# Patient Record
Sex: Female | Born: 2000 | Race: Black or African American | Hispanic: No | Marital: Single | Smoking: Never smoker
Health system: Southern US, Community
[De-identification: ages and names within clinical notes are randomized; demographics above are authoritative.]

## PROBLEM LIST (undated history)

## (undated) DIAGNOSIS — L309 Dermatitis, unspecified: Secondary | ICD-10-CM

## (undated) DIAGNOSIS — G43909 Migraine, unspecified, not intractable, without status migrainosus: Secondary | ICD-10-CM

## (undated) HISTORY — PX: CERVICAL CERCLAGE: SHX1329

## (undated) HISTORY — DX: Dermatitis, unspecified: L30.9

## (undated) HISTORY — DX: Migraine, unspecified, not intractable, without status migrainosus: G43.909

---
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
## 2020-01-26 DIAGNOSIS — N912 Amenorrhea, unspecified: Secondary | ICD-10-CM | POA: Diagnosis not present

## 2020-01-26 DIAGNOSIS — Z3491 Encounter for supervision of normal pregnancy, unspecified, first trimester: Secondary | ICD-10-CM | POA: Diagnosis not present

## 2020-01-26 DIAGNOSIS — O3680X9 Pregnancy with inconclusive fetal viability, other fetus: Secondary | ICD-10-CM | POA: Diagnosis not present

## 2020-01-26 DIAGNOSIS — Z113 Encounter for screening for infections with a predominantly sexual mode of transmission: Secondary | ICD-10-CM | POA: Diagnosis not present

## 2020-01-26 DIAGNOSIS — Z3A09 9 weeks gestation of pregnancy: Secondary | ICD-10-CM | POA: Diagnosis not present

## 2020-02-19 ENCOUNTER — Encounter (HOSPITAL_BASED_OUTPATIENT_CLINIC_OR_DEPARTMENT_OTHER): Payer: Self-pay

## 2020-02-19 ENCOUNTER — Emergency Department (HOSPITAL_BASED_OUTPATIENT_CLINIC_OR_DEPARTMENT_OTHER)
Admission: EM | Admit: 2020-02-19 | Discharge: 2020-02-19 | Disposition: A | Discharge status: Home / Self Care | Payer: Medicaid Other | Attending: Emergency Medicine | Admitting: Emergency Medicine

## 2020-02-19 ENCOUNTER — Other Ambulatory Visit: Payer: Self-pay

## 2020-02-19 DIAGNOSIS — O219 Vomiting of pregnancy, unspecified: Secondary | ICD-10-CM | POA: Diagnosis not present

## 2020-02-19 DIAGNOSIS — Z3A12 12 weeks gestation of pregnancy: Secondary | ICD-10-CM | POA: Diagnosis not present

## 2020-02-19 DIAGNOSIS — R112 Nausea with vomiting, unspecified: Secondary | ICD-10-CM

## 2020-02-19 NOTE — ED Triage Notes (Signed)
Pt states she vomited x 1 at 8pm "it had blood in it"-pt states she is [redacted] weeks pregnant-her OB is [facility] in [place]

## 2020-02-19 NOTE — Discharge Instructions (Addendum)
You have been seen today for vomiting. Please read and follow all provided instructions. Return to the emergency room for worsening condition or new concerning symptoms.    1. Medications:  Continue usual home medications Take medications as prescribed. Please review all of the medicines and only take them if you do not have an allergy to them.   2. Treatment: rest, drink plenty of fluids  3. Follow Up:  Please follow up with OBGYN doctor by scheduling an appointment as soon as possible for a visit     ?

## 2020-02-19 NOTE — ED Provider Notes (Signed)
[facility] EMERGENCY DEPARTMENT Provider Note   CSN: [id number] Arrival date & time: 02/19/20  2047     History Chief Complaint  Patient presents with  . Emesis During Pregnancy    Tammie Davis is a 19 y.o. female G2P0010 currently 101w6d gestation presents to emergency department today with chief complaint of acute onset of emesis. Patient states she was at work and after eating lunch had sudden onset of nausea.  She had one episode of emesis.  She describes her emesis as being mostly water she just drink and small amount of right red blood visible. She denies any associated chest pain or abdominal pain.  She not take any medications for symptoms prior to arrival.  Patient does not drink alcohol or use any drugs. Denies fever, chills, chest pain, shortness of breath, abdominal pain, pelvic pain, cramping, abnormal vaginal bleeding, vaginal discharge.  Her OB is [facility].  Her next OB appointment is scheduled for 02/26/2020.    Past Medical History:  Diagnosis Date  . Eczema   . Migraines     Patient Active Problem List   Diagnosis Date Noted  . Encounter for surveillance of injectable contraceptive 12/04/2018  . 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 Acne vulgaris 11/10/2016  . Headache, migraine 01/20/2015    History reviewed. No pertinent surgical history.   OB History    Gravida  2   Para  0   Term      Preterm      AB  1   Living        SAB  1   TAB      Ectopic      Multiple      Live Births              Family History  Problem Relation Age of Onset  . Asthma Sister     Social History   Tobacco Use  . Smoking status: Never Smoker  . Smokeless tobacco: Never Used  Substance Use Topics  . Alcohol use: No  . Drug use: No    Home Medications Prior to Admission medications   Medication Sig Start Date End Date Taking? Authorizing Provider  cephALEXin (KEFLEX) 500 MG capsule Take 1 capsule (500 mg total) by  mouth 4 (four) times daily. 09/04/19   Mesner, Barbara Cower, MD  ondansetron (ZOFRAN) 4 MG tablet Take 1 tablet (4 mg total) by mouth every 8 (eight) hours as needed for nausea or vomiting. 09/04/19   Mesner, Barbara Cower, MD    Allergies    Patient has no known allergies.  Review of Systems   Review of Systems  All other systems are reviewed and are negative for acute change except as noted in the HPI.   Physical Exam Updated Vital Signs BP 107/60 (BP Location: Right Arm)   Pulse 90   Temp 98.4 F (36.9 C) (Oral)   Resp 16   Ht 5\' 1"  (1.549 m)   Wt 49.4 kg   LMP 08/11/2019   SpO2 100%   BMI 20.60 kg/m   Physical Exam Vitals and nursing note reviewed.  Constitutional:      General: She is not in acute distress.    Appearance: She is not ill-appearing.  HENT:     Head: Normocephalic and atraumatic.     Right Ear: Tympanic membrane and external ear normal.     Left Ear: Tympanic membrane and external ear normal.     Nose: Nose normal.  Mouth/Throat:     Mouth: Mucous membranes are moist.     Pharynx: Oropharynx is clear.  Eyes:     General: No scleral icterus.       Right eye: No discharge.        Left eye: No discharge.     Extraocular Movements: Extraocular movements intact.     Conjunctiva/sclera: Conjunctivae normal.     Pupils: Pupils are equal, round, and reactive to light.  Neck:     Vascular: No JVD.  Cardiovascular:     Rate and Rhythm: Normal rate and regular rhythm.     Pulses: Normal pulses.          Radial pulses are 2+ on the right side and 2+ on the left side.     Heart sounds: Normal heart sounds.  Pulmonary:     Comments: Lungs clear to auscultation in all fields. Symmetric chest rise. No wheezing, rales, or rhonchi. Abdominal:     Comments: Gravid abdomen. Non-tender in all quadrants. No rigidity, no guarding. No peritoneal signs. Normoactive bowel sounds  Musculoskeletal:        General: Normal range of motion.     Cervical back: Normal range of motion.   Skin:    General: Skin is warm and dry.     Capillary Refill: Capillary refill takes less than 2 seconds.  Neurological:     Mental Status: She is oriented to person, place, and time.     GCS: GCS eye subscore is 4. GCS verbal subscore is 5. GCS motor subscore is 6.     Comments: Fluent speech, no facial droop.  Psychiatric:        Behavior: Behavior normal.     ED Results / Procedures / Treatments   Labs (all labs ordered are listed, but only abnormal results are displayed) Labs Reviewed - No data to display  EKG None  Radiology No results found.  Procedures Procedures (including critical care time)  Medications Ordered in ED Medications - No data to display  ED Course  I have reviewed the triage vital signs and the nursing notes.  Pertinent labs & imaging results that were available during my care of the patient were reviewed by me and considered in my medical decision making (see chart for details).    MDM Rules/Calculators/A&P                      She had blood pressure of 105/35 documented in triage.  I evaluated the patient and I rechecked her pressure and it was 106/62.  The RN later checked patient's blood pressure and it was 107/60.  I question the accuracy of the initial blood pressure as she has had multiple normal readings since triage.  Patient is afebrile, no tachycardia, no hypoxia. On exam she is very well-appearing.  She is in no acute distress.  Lungs are clear to auscultation all fields.  Abdomen is nontender, no peritoneal signs. No posterior erythema. Throat exam is normal. No signs of dehydration.  She has had no further episodes of emesis, just the one causing her to come in and seek evaluation tonight. She is tolerating PO intake while here in the ED.  Engaged in shared decision making regarding lab work to check hemoglobin, electrolytes, kidney function.  Patient would rather follow-up with her OB/GYN for blood work as she is feeling asymptomatic  at this time. She declines chest xray as well as she would like to avoid radiation exposure.  I feel this is reasonable given her reassuring exam and vitals.  The patient appears reasonably screened and/or stabilized for discharge and I doubt any other medical condition or other [facility] requiring further screening, evaluation, or treatment in the ED at this time prior to discharge. The patient is safe for discharge with strict return precautions discussed. Patient appears reliable for follow up.   Portions of this note were generated with Lobbyist. Dictation errors may occur despite best attempts at proofreading.   Final Clinical Impression(s) / ED Diagnoses Final diagnoses:  Non-intractable vomiting with nausea, unspecified vomiting type    Rx / DC Orders ED Discharge Orders    None       Cherre Robins, PA-C 02/20/20 0914    Lennice Sites, DO 02/20/20 2352

## 2020-02-19 NOTE — ED Notes (Signed)
Fluid challenge passed, no issues

## 2020-03-03 ENCOUNTER — Emergency Department (HOSPITAL_BASED_OUTPATIENT_CLINIC_OR_DEPARTMENT_OTHER)
Admission: EM | Admit: 2020-03-03 | Discharge: 2020-03-03 | Disposition: A | Discharge status: Home / Self Care | Payer: Medicaid Other | Attending: Emergency Medicine | Admitting: Emergency Medicine

## 2020-03-03 ENCOUNTER — Encounter (HOSPITAL_BASED_OUTPATIENT_CLINIC_OR_DEPARTMENT_OTHER): Payer: Self-pay | Admitting: Emergency Medicine

## 2020-03-03 ENCOUNTER — Other Ambulatory Visit: Payer: Self-pay

## 2020-03-03 DIAGNOSIS — O219 Vomiting of pregnancy, unspecified: Secondary | ICD-10-CM | POA: Diagnosis not present

## 2020-03-03 DIAGNOSIS — Z3492 Encounter for supervision of normal pregnancy, unspecified, second trimester: Secondary | ICD-10-CM | POA: Diagnosis not present

## 2020-03-03 DIAGNOSIS — R112 Nausea with vomiting, unspecified: Secondary | ICD-10-CM

## 2020-03-03 DIAGNOSIS — Z3A14 14 weeks gestation of pregnancy: Secondary | ICD-10-CM | POA: Insufficient documentation

## 2020-03-03 LAB — COMPREHENSIVE METABOLIC PANEL
ALT: 14 U/L (ref 0–44)
AST: 17 U/L (ref 15–41)
Albumin: 3.3 g/dL — ABNORMAL LOW (ref 3.5–5.0)
Alkaline Phosphatase: 50 U/L (ref 38–126)
Anion gap: 6 (ref 5–15)
BUN: 6 mg/dL (ref 6–20)
CO2: 21 mmol/L — ABNORMAL LOW (ref 22–32)
Calcium: 8.7 mg/dL — ABNORMAL LOW (ref 8.9–10.3)
Chloride: 106 mmol/L (ref 98–111)
Creatinine, Ser: 0.39 mg/dL — ABNORMAL LOW (ref 0.44–1.00)
GFR calc Af Amer: >60 mL/min (ref >60)
GFR calc non Af Amer: >60 mL/min (ref >60)
Glucose, Bld: 80 mg/dL (ref 70–99)
Potassium: 3.7 mmol/L (ref 3.5–5.1)
Sodium: 133 mmol/L — ABNORMAL LOW (ref 135–145)
Total Bilirubin: 0.3 mg/dL (ref 0.3–1.2)
Total Protein: 6.5 g/dL (ref 6.5–8.1)

## 2020-03-03 LAB — CBC WITH DIFFERENTIAL/PLATELET
Abs Immature Granulocytes: 0.03 10*3/uL (ref 0.00–0.07)
Basophils Absolute: 0 10*3/uL (ref 0.0–0.1)
Basophils Relative: 0 %
Eosinophils Absolute: 0.1 10*3/uL (ref 0.0–0.5)
Eosinophils Relative: 1 %
HCT: 36.8 % (ref 36.0–46.0)
Hemoglobin: 11.7 g/dL — ABNORMAL LOW (ref 12.0–15.0)
Immature Granulocytes: 0 %
Lymphocytes Relative: 13 %
Lymphs Abs: 1.2 10*3/uL (ref 0.7–4.0)
MCH: 28 pg (ref 26.0–34.0)
MCHC: 31.8 g/dL (ref 30.0–36.0)
MCV: 88 fL (ref 80.0–100.0)
Monocytes Absolute: 0.9 10*3/uL (ref 0.1–1.0)
Monocytes Relative: 10 %
Neutro Abs: 6.8 10*3/uL (ref 1.7–7.7)
Neutrophils Relative %: 76 %
Platelets: 238 10*3/uL (ref 150–400)
RBC: 4.18 MIL/uL (ref 3.87–5.11)
RDW: 15.1 % (ref 11.5–15.5)
WBC: 9 10*3/uL (ref 4.0–10.5)
nRBC: 0 % (ref 0.0–0.2)

## 2020-03-03 LAB — URINALYSIS, ROUTINE W REFLEX MICROSCOPIC
Bilirubin Urine: NEGATIVE
Glucose, UA: NEGATIVE mg/dL
Hgb urine dipstick: NEGATIVE
Ketones, ur: NEGATIVE mg/dL
Nitrite: NEGATIVE
Protein, ur: NEGATIVE mg/dL
Specific Gravity, Urine: 1.02 (ref 1.005–1.030)
pH: 6 (ref 5.0–8.0)

## 2020-03-03 LAB — URINALYSIS, MICROSCOPIC (REFLEX): RBC / HPF: NONE SEEN RBC/hpf (ref 0–5)

## 2020-03-03 LAB — LIPASE, BLOOD: Lipase: 31 U/L (ref 11–51)

## 2020-03-03 MED ORDER — SODIUM CHLORIDE 0.9 % IV BOLUS
1000.0000 mL | Freq: Once | INTRAVENOUS | Status: AC
  Administered 2020-03-03: 1000 mL via INTRAVENOUS

## 2020-03-03 MED ORDER — ONDANSETRON HCL 4 MG/2ML IJ SOLN
4.0000 mg | Freq: Once | INTRAMUSCULAR | Status: AC
  Administered 2020-03-03: 4 mg via INTRAVENOUS
  Filled 2020-03-03: qty 2

## 2020-03-03 NOTE — ED Triage Notes (Signed)
Pt states she vomited x 1 episode today with possible blood in emesis. Pt also reports light pink color on tissue after urinating. Pt was seen her 3/25 with same symptoms. Pt is [redacted] weeks pregnant.

## 2020-03-03 NOTE — ED Provider Notes (Signed)
[place] EMERGENCY DEPARTMENT Provider Note   CSN: [id number] Arrival date & time: 03/03/20  2007     History Chief Complaint  Patient presents with  . Emesis    Tammie Davis is a 19 y.o. female.  Patient is a 19 year old female G2 P0010 at approximately [redacted] weeks gestation.  She presents today for evaluation of nausea vomiting.  This started earlier this afternoon.  She describes blood in her emesis.  She also reports urinating and noticing a pinkish color to it.  She denies any vaginal bleeding or spotting.  She denies any abdominal pain.  Patient has been seen twice in the ER with similar issues.  She has had an ultrasound performed by her OB showing an intrauterine pregnancy.  The history is provided by the patient.  Emesis Severity:  Moderate Timing:  Intermittent Chronicity:  Recurrent Relieved by:  Nothing Worsened by:  Nothing      Past Medical History:  Diagnosis Date  . Eczema   . Migraines     Patient Active Problem List   Diagnosis Date Noted  . Encounter for surveillance of injectable contraceptive 12/04/2018  . 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 Acne vulgaris 11/10/2016  . Headache, migraine 01/20/2015    History reviewed. No pertinent surgical history.   OB History    Gravida  2   Para  0   Term      Preterm      AB  1   Living        SAB  1   TAB      Ectopic      Multiple      Live Births              Family History  Problem Relation Age of Onset  . Asthma Sister     Social History   Tobacco Use  . Smoking status: Never Smoker  . Smokeless tobacco: Never Used  Substance Use Topics  . Alcohol use: No  . Drug use: No    Home Medications Prior to Admission medications   Medication Sig Start Date End Date Taking? Authorizing Provider  cephALEXin (KEFLEX) 500 MG capsule Take 1 capsule (500 mg total) by mouth 4 (four) times daily. 09/04/19   Mesner, Corene Cornea, MD  ondansetron (ZOFRAN) 4 MG  tablet Take 1 tablet (4 mg total) by mouth every 8 (eight) hours as needed for nausea or vomiting. 09/04/19   Mesner, Corene Cornea, MD    Allergies    Patient has no known allergies.  Review of Systems   Review of Systems  Gastrointestinal: Positive for vomiting.  All other systems reviewed and are negative.   Physical Exam Updated Vital Signs BP (!) 106/57 (BP Location: Right Arm)   Pulse 87   Temp 98.5 F (36.9 C) (Oral)   Resp 16   Ht 5\' 1"  (1.549 m)   Wt 50.8 kg   LMP 08/11/2019   SpO2 100%   BMI 21.16 kg/m   Physical Exam Vitals and nursing note reviewed.  Constitutional:      General: She is not in acute distress.    Appearance: She is well-developed. She is not diaphoretic.  HENT:     Head: Normocephalic and atraumatic.  Cardiovascular:     Rate and Rhythm: Normal rate and regular rhythm.     Heart sounds: No murmur. No friction rub. No gallop.   Pulmonary:     Effort: Pulmonary effort is normal. No  respiratory distress.     Breath sounds: Normal breath sounds. No wheezing.  Abdominal:     General: Bowel sounds are normal. There is no distension.     Palpations: Abdomen is soft.     Tenderness: There is no abdominal tenderness.  Musculoskeletal:        General: Normal range of motion.     Cervical back: Normal range of motion and neck supple.  Skin:    General: Skin is warm and dry.  Neurological:     Mental Status: She is alert and oriented to person, place, and time.     ED Results / Procedures / Treatments   Labs (all labs ordered are listed, but only abnormal results are displayed) Labs Reviewed  URINALYSIS, ROUTINE W REFLEX MICROSCOPIC  COMPREHENSIVE METABOLIC PANEL  CBC WITH DIFFERENTIAL/PLATELET  LIPASE, BLOOD    EKG None  Radiology No results found.  Procedures Procedures (including critical care time)  Medications Ordered in ED Medications  sodium chloride 0.9 % bolus 1,000 mL (has no administration in time range)  ondansetron  (ZOFRAN) injection 4 mg (has no administration in time range)    ED Course  I have reviewed the triage vital signs and the nursing notes.  Pertinent labs & imaging results that were available during my care of the patient were reviewed by me and considered in my medical decision making (see chart for details).    MDM Rules/Calculators/A&P  Patient is a 19 year old female presenting with complaints of vomiting which she describes as a small amount of blood as well as a pinkish tint to her urine.  Patient is pregnant approximately 14 weeks.  She is having no abdominal pain and abdominal exam is benign.  Laboratory studies are unremarkable and urinalysis shows only trace leukocytes.  Patient has been seen by her OB and has had an ultrasound showing an intrauterine pregnancy.  I see no indication this evening for imaging studies.  Patient to be discharged with OB follow-up and as needed return.  Final Clinical Impression(s) / ED Diagnoses Final diagnoses:  None    Rx / DC Orders ED Discharge Orders    None       Geoffery Lyons, MD 03/03/20 2236

## 2020-03-03 NOTE — Discharge Instructions (Signed)
Follow-up with your OB next week, sooner if symptoms worsen or change.

## 2020-03-09 ENCOUNTER — Encounter (HOSPITAL_COMMUNITY): Payer: Self-pay | Admitting: Obstetrics & Gynecology

## 2020-03-09 ENCOUNTER — Other Ambulatory Visit: Payer: Self-pay

## 2020-03-09 ENCOUNTER — Inpatient Hospital Stay (HOSPITAL_COMMUNITY)
Admission: AD | Admit: 2020-03-09 | Discharge: 2020-03-09 | Disposition: A | Discharge status: Home / Self Care | Payer: Medicaid Other | Attending: Obstetrics & Gynecology | Admitting: Obstetrics & Gynecology

## 2020-03-09 DIAGNOSIS — O99612 Diseases of the digestive system complicating pregnancy, second trimester: Secondary | ICD-10-CM | POA: Diagnosis not present

## 2020-03-09 DIAGNOSIS — R12 Heartburn: Secondary | ICD-10-CM

## 2020-03-09 DIAGNOSIS — O210 Mild hyperemesis gravidarum: Secondary | ICD-10-CM | POA: Insufficient documentation

## 2020-03-09 DIAGNOSIS — Z3A15 15 weeks gestation of pregnancy: Secondary | ICD-10-CM

## 2020-03-09 DIAGNOSIS — O26892 Other specified pregnancy related conditions, second trimester: Secondary | ICD-10-CM

## 2020-03-09 DIAGNOSIS — O219 Vomiting of pregnancy, unspecified: Secondary | ICD-10-CM

## 2020-03-09 LAB — URINALYSIS, ROUTINE W REFLEX MICROSCOPIC
Bilirubin Urine: NEGATIVE
Glucose, UA: NEGATIVE mg/dL
Hgb urine dipstick: NEGATIVE
Ketones, ur: NEGATIVE mg/dL
Leukocytes,Ua: NEGATIVE
Nitrite: NEGATIVE
Protein, ur: NEGATIVE mg/dL
Specific Gravity, Urine: 1.016 (ref 1.005–1.030)
pH: 8 (ref 5.0–8.0)

## 2020-03-09 MED ORDER — FAMOTIDINE 20 MG PO TABS: 20.0000 mg | ORAL_TABLET | Freq: Every day | ORAL | 0 refills | Status: DC

## 2020-03-09 MED ORDER — ONDANSETRON 4 MG PO TBDP: 4.0000 mg | ORAL_TABLET | Freq: Three times a day (TID) | ORAL | 0 refills | Status: DC | PRN

## 2020-03-09 MED ORDER — ONDANSETRON 4 MG PO TBDP
8.0000 mg | ORAL_TABLET | Freq: Once | ORAL | Status: AC
  Administered 2020-03-09: 11:00:00 8 mg via ORAL
  Filled 2020-03-09: qty 2

## 2020-03-09 NOTE — MAU Note (Signed)
For about 2-3 wks, went to [facility] twice, was told she needed nausea med. Called her provider, was told an rx was sent, yet was at pharmacy. Then not answering phone, so still doesn't have meds. So still throwing up, throat still irritated, blood still in vomit.

## 2020-03-09 NOTE — MAU Provider Note (Signed)
Chief Complaint: Morning Sickness and Heartburn   First Provider Initiated Contact with Patient 03/09/20 [phone]     SUBJECTIVE HPI: Tammie Davis is a 19 y.o. G2P0010 at [redacted]w[redacted]d who presents to M[facility] Admissions reporting nausea & vomiting. Symptoms ongoing with pregnancy. Went to [facility] twice last week but wasn't prescribed meds. Goes to [employer] & hasn't been prescribed antiemetic. Just received message from [facility] & has been scheduled an appointment for tomorrow. State she normally vomits 3-4 times per day, more when she's at work. Has noticed some bloody streaks in her vomit. Endorses heartburn. Has not treated symptoms. Denies diarrhea, fever, abdominal pain, or vaginal bleeding.   Past Medical History:  Diagnosis Date  . Eczema   . Migraines    OB History  Gravida Para Term Preterm AB Living  2 0     1    SAB TAB Ectopic Multiple Live Births  1            # Outcome Date GA Lbr Len/2nd Weight Sex Delivery Anes PTL Lv  2 Current           1 SAB            History reviewed. No pertinent surgical history. Social History   Socioeconomic History  . Marital status: Single    Spouse name: Not on file  . Number of children: Not on file  . Years of education: Not on file  . Highest education level: Not on file  Occupational History  . Not on file  Tobacco Use  . Smoking status: Never Smoker  . Smokeless tobacco: Never Used  Substance and Sexual Activity  . Alcohol use: No  . Drug use: No  . Sexual activity: Yes  Other Topics Concern  . Not on file  Social History Narrative   Lives with Dad.  Parents separated   Social Determinants of Health   Financial Resource Strain:   . Difficulty of Paying Living Expenses:   Food Insecurity:   . Worried About Programme researcher, broadcasting/film/video in the Last Year:   . Barista in the Last Year:   Transportation Needs:   . Freight forwarder (Medical):   Marland Kitchen Lack of Transportation (Non-Medical):   Physical Activity:   . Days of  Exercise per Week:   . Minutes of Exercise per Session:   Stress:   . Feeling of Stress :   Social Connections:   . Frequency of Communication with Friends and Family:   . Frequency of Social Gatherings with Friends and Family:   . Attends Religious Services:   . Active Member of Clubs or Organizations:   . Attends Banker Meetings:   Marland Kitchen Marital Status:   Intimate Partner Violence:   . Fear of Current or Ex-Partner:   . Emotionally Abused:   Marland Kitchen Physically Abused:   . Sexually Abused:    Family History  Problem Relation Age of Onset  . Asthma Sister    No current facility-administered medications on file prior to encounter.   Current Outpatient Medications on File Prior to Encounter  Medication Sig Dispense Refill  . cephALEXin (KEFLEX) 500 MG capsule Take 1 capsule (500 mg total) by mouth 4 (four) times daily. 40 capsule 0  . ondansetron (ZOFRAN) 4 MG tablet Take 1 tablet (4 mg total) by mouth every 8 (eight) hours as needed for nausea or vomiting. 12 tablet 0   No Known Allergies  I have reviewed patient's  Past Medical Hx, Surgical Hx, Family Hx, Social Hx, medications and allergies.   Review of Systems  Constitutional: Negative.   Gastrointestinal: Positive for nausea and vomiting. Negative for abdominal pain.  Genitourinary: Negative.     OBJECTIVE Patient Vitals for the past 24 hrs:  BP Temp Temp src Pulse Resp SpO2 Height Weight  03/09/20 0926 (!) 107/57 98.8 F (37.1 C) Oral 86 16 100 % 5\' 1"  (1.549 m) 50.8 kg   Constitutional: Well-developed, well-nourished female in no acute distress.  Cardiovascular: normal rate & rhythm, no murmur Respiratory: normal rate and effort. Lung sounds clear throughout GI: Abd soft, non-tender, Pos BS x 4. No guarding or rebound tenderness MS: Extremities nontender, no edema, normal ROM Neurologic: Alert and oriented x 4.     LAB RESULTS Results for orders placed or performed during the hospital encounter of 03/09/20  (from the past 24 hour(s))  Urinalysis, Routine w reflex microscopic     Status: Abnormal   Collection Time: 03/09/20 10:01 AM  Result Value Ref Range   Color, Urine YELLOW YELLOW   APPearance HAZY (A) CLEAR   Specific Gravity, Urine 1.016 1.005 - 1.030   pH 8.0 5.0 - 8.0   Glucose, UA NEGATIVE NEGATIVE mg/dL   Hgb urine dipstick NEGATIVE NEGATIVE   Bilirubin Urine NEGATIVE NEGATIVE   Ketones, ur NEGATIVE NEGATIVE mg/dL   Protein, ur NEGATIVE NEGATIVE mg/dL   Nitrite NEGATIVE NEGATIVE   Leukocytes,Ua NEGATIVE NEGATIVE    IMAGING No results found.  MAU COURSE Orders Placed This Encounter  Procedures  . Urinalysis, Routine w reflex microscopic  . Discharge patient   Meds ordered this encounter  Medications  . ondansetron (ZOFRAN-ODT) disintegrating tablet 8 mg  . ondansetron (ZOFRAN ODT) 4 MG disintegrating tablet    Sig: Take 1 tablet (4 mg total) by mouth every 8 (eight) hours as needed for nausea or vomiting.    Dispense:  15 tablet    Refill:  0    Order Specific Question:   Supervising Provider    Answer:   ERVIN, MICHAEL L [[id number]]  . famotidine (PEPCID) 20 MG tablet    Sig: Take 1 tablet (20 mg total) by mouth daily.    Dispense:  30 tablet    Refill:  0    Order Specific Question:   Supervising Provider    Answer:   03/11/20, MICHAEL L [[id number]]    MDM FHT present via doppler  U/a does not indicate dehydration & patient not vomiting while in MAU. Zofran 8 mg ODT given. Patient reports improvement in symptoms & ready to be discharged home. Would like rx for zofran. Will also send in rx for pepcid.   ASSESSMENT 1. Nausea and vomiting during pregnancy prior to [redacted] weeks gestation   2. [redacted] weeks gestation of pregnancy   3. Heartburn during pregnancy in second trimester     PLAN Discharge home in stable condition. Rx zofran & pepcid F/u with ob/gyn   Follow-up Information    [facility] Follow up.   Specialty: Obstetrics and  Gynecology Contact information: [address] [place] [phone]         Allergies as of 03/09/2020   No Known Allergies     Medication List    STOP taking these medications   cephALEXin 500 MG capsule Commonly known as: KEFLEX   ondansetron 4 MG tablet Commonly known as: ZOFRAN     TAKE these medications   famotidine  20 MG tablet Commonly known as: PEPCID Take 1 tablet (20 mg total) by mouth daily.   ondansetron 4 MG disintegrating tablet Commonly known as: Zofran ODT Take 1 tablet (4 mg total) by mouth every 8 (eight) hours as needed for nausea or vomiting.        Jorje Guild, NP 03/09/2020  12:19 PM

## 2020-03-09 NOTE — Discharge Instructions (Signed)
Heartburn During Pregnancy Heartburn is a type of pain or discomfort that can happen in the throat or chest. It is often described as a burning sensation. Heartburn is common during pregnancy because:  A hormone (progesterone) that is released during pregnancy may relax the valve (lower esophageal sphincter, or LES) that separates the esophagus from the stomach. This allows stomach acid to move up into the esophagus, causing heartburn.  The uterus gets larger and pushes up on the stomach, which pushes more acid into the esophagus. This is especially true in the later stages of pregnancy. Heartburn usually goes away or gets better after giving birth. What are the causes? Heartburn is caused by stomach acid backing up into the esophagus (reflux). Reflux can be triggered by:  Changing hormone levels.  Large meals.  Certain foods and beverages, such as coffee, chocolate, onions, and peppermint.  Exercise.  Increased stomach acid production. What increases the risk? You are more likely to experience heartburn during pregnancy if you:  Had heartburn prior to becoming pregnant.  Have been pregnant more than once before.  Are overweight or obese. The likelihood that you will get heartburn also increases as you get farther along in your pregnancy, especially during the last trimester. What are the signs or symptoms? Symptoms of this condition include:  Burning pain in the chest or lower throat.  Bitter taste in the mouth.  Coughing.  Problems swallowing.  Vomiting.  Hoarse voice.  Asthma. Symptoms may get worse when you lie down or bend over. Symptoms are often worse at night. How is this diagnosed? This condition is diagnosed based on:  Your medical history.  Your symptoms.  Blood tests to check for a certain type of bacteria associated with heartburn.  Whether taking heartburn medicine relieves your symptoms.  Examination of the stomach and esophagus using a tube with  a light and camera on the end (endoscopy). How is this treated? Treatment varies depending on how severe your symptoms are. Your health care provider may recommend:  Over-the-counter medicines (antacids or acid reducers) for mild heartburn.  Prescription medicines to decrease stomach acid or to protect your stomach lining.  Certain changes in your diet.  Raising the head of your bed so it is higher than the foot of the bed. This helps prevent stomach acid from backing up into the esophagus when you are lying down. Follow these instructions at home: Eating and drinking  Do not drink alcohol during your pregnancy.  Identify foods and beverages that make your symptoms worse, and avoid them.  Beverages that you may want to avoid include: ? Coffee and tea (with or without caffeine). ? Energy drinks and sports drinks. ? Carbonated drinks or sodas. ? Citrus fruit juices.  Foods that you may want to avoid include: ? Chocolate and cocoa. ? Peppermint and mint flavorings. ? Garlic, onions, and horseradish. ? Spicy and acidic foods, including peppers, chili powder, curry powder, vinegar, hot sauces, and barbecue sauce. ? Citrus fruits, such as oranges, lemons, and limes. ? Tomato-based foods, such as red sauce, chili, and salsa. ? Fried and fatty foods, such as donuts, french fries, potato chips, and high-fat dressings. ? High-fat meats, such as hot dogs, cold cuts, sausage, ham, and bacon. ? High-fat dairy items, such as whole milk, butter, and cheese.  Eat small, frequent meals instead of large meals.  Avoid drinking large amounts of liquid with your meals.  Avoid eating meals during the 2-3 hours before bedtime.  Avoid lying down right   after you eat.  Do not exercise right after you eat. Medicines  Take over-the-counter and prescription medicines only as told by your health care provider.  Do not take aspirin, ibuprofen, or other NSAIDs unless your health care provider tells  you to do that.  You may be instructed to avoid medicines that contain sodium bicarbonate. General instructions   If directed, raise the head of your bed about 6 inches (15 cm) by putting blocks under the legs. Sleeping with more pillows does not effectively relieve heartburn because it only changes the position of your head.  Do not use any products that contain nicotine or tobacco, such as cigarettes and e-cigarettes. If you need help quitting, ask your health care provider.  Wear loose-fitting clothing.  Try to reduce your stress, such as with yoga or meditation. If you need help managing stress, ask your health care provider.  Maintain a healthy weight. If you are overweight, work with your health care provider to safely lose weight.  Keep all follow-up visits as told by your health care provider. This is important. Contact a health care provider if:  You develop new symptoms.  Your symptoms do not improve with treatment.  You have unexplained weight loss.  You have difficulty swallowing.  You make loud sounds when you breathe (wheeze).  You have a cough that does not go away.  You have frequent heartburn for more than 2 weeks.  You have nausea or vomiting that does not get better with treatment.  You have pain in your abdomen. Get help right away if:  You have severe chest pain that spreads to your arm, neck, or jaw.  You feel sweaty, dizzy, or light-headed.  You have shortness of breath.  You have pain when swallowing.  You vomit, and your vomit looks like blood or coffee grounds.  Your stool is bloody or black. This information is not intended to replace advice given to you by your health care provider. Make sure you discuss any questions you have with your health care provider. Document Revised: 02/11/2018 Document Reviewed: 07/31/2016 Elsevier Patient Education  2020 Elsevier Inc. Morning Sickness  Morning sickness is when a woman feels nauseous during  pregnancy. This nauseous feeling may or may not come with vomiting. It often occurs in the morning, but it can be a problem at any time of day. Morning sickness is most common during the first trimester. In some cases, it may continue throughout pregnancy. Although morning sickness is unpleasant, it is usually harmless unless the woman develops severe and continual vomiting (hyperemesis gravidarum), a condition that requires more intense treatment. What are the causes? The exact cause of this condition is not known, but it seems to be related to normal hormonal changes that occur in pregnancy. What increases the risk? You are more likely to develop this condition if:  You experienced nausea or vomiting before your pregnancy.  You had morning sickness during a previous pregnancy.  You are pregnant with more than one baby, such as twins. What are the signs or symptoms? Symptoms of this condition include:  Nausea.  Vomiting. How is this diagnosed? This condition is usually diagnosed based on your signs and symptoms. How is this treated? In many cases, treatment is not needed for this condition. Making some changes to what you eat may help to control symptoms. Your health care provider may also prescribe or recommend:  Vitamin B6 supplements.  Anti-nausea medicines.  Ginger. Follow these instructions at home: Medicines  Take  over-the-counter and prescription medicines only as told by your health care provider. Do not use any prescription, over-the-counter, or herbal medicines for morning sickness without first talking with your health care provider.  Taking multivitamins before getting pregnant can prevent or decrease the severity of morning sickness in most women. Eating and drinking  Eat a piece of dry toast or crackers before getting out of bed in the morning.  Eat 5 or 6 small meals a day.  Eat dry and bland foods, such as rice or a baked potato. Foods that are high in  carbohydrates are often helpful.  Avoid greasy, fatty, and spicy foods.  Have someone cook for you if the smell of any food causes nausea and vomiting.  If you feel nauseous after taking prenatal vitamins, take the vitamins at night or with a snack.  Snack on protein foods between meals if you are hungry. Nuts, yogurt, and cheese are good options.  Drink fluids throughout the day.  Try ginger ale made with real ginger, ginger tea made from fresh grated ginger, or ginger candies. General instructions  Do not use any products that contain nicotine or tobacco, such as cigarettes and e-cigarettes. If you need help quitting, ask your health care provider.  Get an air purifier to keep the air in your house free of odors.  Get plenty of fresh air.  Try to avoid odors that trigger your nausea.  Consider trying these methods to help relieve symptoms: ? Wearing an acupressure wristband. These wristbands are often worn for seasickness. ? Acupuncture. Contact a health care provider if:  Your home remedies are not working and you need medicine.  You feel dizzy or light-headed.  You are losing weight. Get help right away if:  You have persistent and uncontrolled nausea and vomiting.  You faint.  You have severe pain in your abdomen. Summary  Morning sickness is when a woman feels nauseous during pregnancy. This nauseous feeling may or may not come with vomiting.  Morning sickness is most common during the first trimester.  It often occurs in the morning, but it can be a problem at any time of day.  In many cases, treatment is not needed for this condition. Making some changes to what you eat may help to control symptoms. This information is not intended to replace advice given to you by your health care provider. Make sure you discuss any questions you have with your health care provider. Document Revised: 10/26/2017 Document Reviewed: 12/16/2016 Elsevier Patient Education  2020  [employer].

## 2020-03-11 ENCOUNTER — Other Ambulatory Visit (HOSPITAL_COMMUNITY): Payer: Self-pay | Admitting: Obstetrics & Gynecology

## 2020-03-11 DIAGNOSIS — Z3A19 19 weeks gestation of pregnancy: Secondary | ICD-10-CM

## 2020-03-11 DIAGNOSIS — Z363 Encounter for antenatal screening for malformations: Secondary | ICD-10-CM

## 2020-04-05 ENCOUNTER — Ambulatory Visit (HOSPITAL_COMMUNITY): Payer: Medicaid Other | Attending: Obstetrics & Gynecology

## 2020-04-12 ENCOUNTER — Encounter: Payer: Self-pay | Admitting: Pediatrics

## 2020-05-17 ENCOUNTER — Telehealth: Payer: Self-pay

## 2020-05-17 ENCOUNTER — Ambulatory Visit: Payer: Medicaid Other | Admitting: Family

## 2020-05-17 NOTE — Telephone Encounter (Signed)
-----   Message from Georges Mouse, NP sent at 05/17/2020 10:10 AM EDT ----- Regarding: missed appt Please call and offer another appointment, including BH if needed.  Can be virtual or in person, her preference.

## 2020-05-17 NOTE — Telephone Encounter (Signed)
Called number on file, no answer, left VM to call office back if she would prefer to have an appointment with our provider or behavioral health. Gave call back number.

## 2020-10-27 ENCOUNTER — Other Ambulatory Visit: Payer: Self-pay

## 2020-10-27 ENCOUNTER — Encounter (HOSPITAL_BASED_OUTPATIENT_CLINIC_OR_DEPARTMENT_OTHER): Payer: Self-pay | Admitting: *Deleted

## 2020-10-27 ENCOUNTER — Emergency Department (HOSPITAL_BASED_OUTPATIENT_CLINIC_OR_DEPARTMENT_OTHER)
Admission: EM | Admit: 2020-10-27 | Discharge: 2020-10-27 | Disposition: A | Discharge status: Home / Self Care | Payer: Medicaid Other | Attending: Emergency Medicine | Admitting: Emergency Medicine

## 2020-10-27 DIAGNOSIS — F53 Postpartum depression: Secondary | ICD-10-CM

## 2020-10-27 DIAGNOSIS — Z8759 Personal history of other complications of pregnancy, childbirth and the puerperium: Secondary | ICD-10-CM | POA: Diagnosis not present

## 2020-10-27 DIAGNOSIS — F32A Depression, unspecified: Secondary | ICD-10-CM | POA: Diagnosis present

## 2020-10-27 DIAGNOSIS — O99345 Other mental disorders complicating the puerperium: Secondary | ICD-10-CM

## 2020-10-27 NOTE — ED Provider Notes (Signed)
[facility] EMERGENCY DEPARTMENT Provider Note   CSN: [id number] Arrival date & time: 10/27/20  1901     History Chief Complaint  Patient presents with  . Depression    Tammie Davis is a 19 y.o. female with a past medical history of miscarriage who presents today for evaluation of depression.  She states that since her miscarriage about 7 months ago she has had depression and anxiety.  She states that she has tried to get help through her primary care doctor however has been unable to get counseling.  She has been offered meds however declined.  She denies SI, HI, or AVH however she does note that she has shaved her head twice.  She denies self-harm or cutting behaviors.  She has no history of suicide attempt or depression.    HPI     Past Medical History:  Diagnosis Date  . Eczema   . Migraines     Patient Active Problem List   Diagnosis Date Noted  . Encounter for surveillance of injectable contraceptive 12/04/2018  . 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 Acne vulgaris 11/10/2016  . Headache, migraine 01/20/2015    History reviewed. No pertinent surgical history.   OB History    Gravida  2   Para  0   Term      Preterm      AB  1   Living        SAB  1   TAB      Ectopic      Multiple      Live Births              Family History  Problem Relation Age of Onset  . Asthma Sister     Social History   Tobacco Use  . Smoking status: Never Smoker  . Smokeless tobacco: Never Used  Vaping Use  . Vaping Use: Never used  Substance Use Topics  . Alcohol use: No  . Drug use: No    Home Medications Prior to Admission medications   Medication Sig Start Date End Date Taking? Authorizing Provider  famotidine (PEPCID) 20 MG tablet Take 1 tablet (20 mg total) by mouth daily. 03/09/20 04/08/20  Judeth Horn, NP  ondansetron (ZOFRAN ODT) 4 MG disintegrating tablet Take 1 tablet (4 mg total) by mouth every 8 (eight) hours  as needed for nausea or vomiting. 03/09/20   Judeth Horn, NP    Allergies    Patient has no known allergies.  Review of Systems   Review of Systems  Constitutional: Negative for chills and fever.  Neurological: Negative for weakness and headaches.  Psychiatric/Behavioral: Positive for dysphoric mood. Negative for hallucinations, self-injury, sleep disturbance and suicidal ideas. The patient is nervous/anxious.   All other systems reviewed and are negative.   Physical Exam Updated Vital Signs BP 95/71 (BP Location: Right Arm)   Pulse 75   Temp 98.2 F (36.8 C) (Oral)   Resp 14   Ht 5\' 2"  (1.575 m)   Wt 49.9 kg   LMP 10/27/2020   SpO2 100%   Breastfeeding Unknown   BMI 20.12 kg/m   Physical Exam Vitals and nursing note reviewed.  Constitutional:      General: She is not in acute distress.    Appearance: She is well-developed. She is not diaphoretic.  HENT:     Head: Normocephalic and atraumatic.  Eyes:     General: No scleral icterus.  Right eye: No discharge.        Left eye: No discharge.     Conjunctiva/sclera: Conjunctivae normal.  Cardiovascular:     Rate and Rhythm: Normal rate and regular rhythm.  Pulmonary:     Effort: Pulmonary effort is normal. No respiratory distress.     Breath sounds: No stridor.  Abdominal:     General: There is no distension.  Genitourinary:    Comments: Not indicated Musculoskeletal:        General: No deformity.     Cervical back: Normal range of motion.  Skin:    General: Skin is warm and dry.  Neurological:     Mental Status: She is alert.     Motor: No abnormal muscle tone.  Psychiatric:        Attention and Perception: Attention normal.        Mood and Affect: Affect is blunt and tearful.        Speech: Speech normal.        Behavior: Behavior normal.        Thought Content: Thought content normal. Thought content does not include homicidal or suicidal ideation. Thought content does not include homicidal or  suicidal plan.        Cognition and Memory: Cognition normal.     ED Results / Procedures / Treatments   Labs (all labs ordered are listed, but only abnormal results are displayed) Labs Reviewed - No data to display  EKG None  Radiology No results found.  Procedures Procedures (including critical care time)  Medications Ordered in ED Medications - No data to display  ED Course  I have reviewed the triage vital signs and the nursing notes.  Pertinent labs & imaging results that were available during my care of the patient were reviewed by me and considered in my medical decision making (see chart for details).    MDM Rules/Calculators/A&P                         Patient is a 19 year old woman who presents today requesting resources for help with anxiety and depression since her miscarriage 7 months ago.  She denies SI, HI, or AVH.  No history of self-harm or thoughts of harming herself.  We discussed multiple resources including she is given Facilities manager, information for [facility] behavioral health urgent care.  She states that she feels like she is able to be safe and denies any acute concerns.  She states that she felt better after we talked, we discussed that miscarriage is an incredibly common thing and that she is not alone.    Additionally I recommended that she research for possible pregnancy loss support groups.  No evidence that patient is a threat to herself or anyone else, TTS evaluation is not indicated.  She is given resources and discharge.  Return precautions were discussed with patient who states their understanding.  At the time of discharge patient denied any unaddressed complaints or concerns.  Patient is agreeable for discharge home.  Note: Portions of this report may have been transcribed using voice recognition software. Every effort was made to ensure accuracy; however, inadvertent computerized transcription errors may be present   Final Clinical  Impression(s) / ED Diagnoses Final diagnoses:  Postpartum depression  History of miscarriage    Rx / DC Orders ED Discharge Orders    None       Cristina Gong, PA-C 10/27/20 2148  Charlynne Pander, MD 10/27/20 [phone]

## 2020-10-27 NOTE — ED Notes (Signed)
Pt A&Ox4, verbalized understanding of f/u info. Denies SI/HI, left ED w/o incident.

## 2020-10-27 NOTE — Discharge Instructions (Addendum)
As we discussed today I would recommend that you look through the resource guide and call some of the options to see if they will see you as a new patient. If your symptoms worsen, you have thoughts about harming yourself or anyone else, feel like you cannot be safe or have any concerns I would recommend trying to go to the [facility] behavioral health center.  You can also call them to attempt to see if they will offer counseling that you can go to.  Additionally I would recommend researching [facility] and looking for pregnancy loss support groups as you are not alone.

## 2020-10-27 NOTE — ED Triage Notes (Signed)
C/o depression and anxiety x 7 months after miscarriage, denies HI or SI

## 2020-11-27 NOTE — L&D Delivery Note (Addendum)
OB/GYN Faculty Practice Delivery Note  Tammie Davis is a 20 y.o. G2P0100 s/p SVD at [redacted]w[redacted]d. She was admitted for SOL.   ROM: 0h 60m with clear fluid GBS Status: negative Maximum Maternal Temperature: 98.7 F  Delivery Date/Time: 2241 Delivery: Called to room and patient was complete and pushing. Head delivered LOA. Nuchal cord was reduced after delivery. Shoulder and body delivered in usual fashion. Infant with spontaneous cry, placed on mother's abdomen, dried and stimulated. Cord clamped x 2 after 1-minute delay, and cut by FOB under my direct supervision. Cord blood drawn. Placenta delivered spontaneously with gentle cord traction. Fundus firm with massage and pitocin. Dose of TXA was given due to constant trickle of blood and passage of several clots. Minimal bleeding after TXA and continued fundal massage. Labia, perineum, vagina, and cervix were inspected, with 2nd degree laceration. Laceration repaired with 2-0 vicryl.   Placenta: membranes intact, 3VC Complications: none Lacerations: 2nd degree laceration EBL: 600 Analgesia: epidural  Infant: baby boy  APGARs 8,9  weight pending  Betti Cruz, MD PGY-2 Family Medicine Resident  The above was performed under my direct supervision and guidance.

## 2021-05-02 ENCOUNTER — Ambulatory Visit: Payer: Medicaid Other

## 2021-05-23 ENCOUNTER — Encounter (HOSPITAL_BASED_OUTPATIENT_CLINIC_OR_DEPARTMENT_OTHER): Payer: Self-pay | Admitting: Urology

## 2021-05-23 ENCOUNTER — Emergency Department (HOSPITAL_BASED_OUTPATIENT_CLINIC_OR_DEPARTMENT_OTHER)
Admission: EM | Admit: 2021-05-23 | Discharge: 2021-05-23 | Disposition: A | Discharge status: Home / Self Care | Payer: Medicaid Other | Attending: Emergency Medicine | Admitting: Emergency Medicine

## 2021-05-23 ENCOUNTER — Other Ambulatory Visit: Payer: Self-pay

## 2021-05-23 DIAGNOSIS — Z3A12 12 weeks gestation of pregnancy: Secondary | ICD-10-CM | POA: Insufficient documentation

## 2021-05-23 DIAGNOSIS — R8271 Bacteriuria: Secondary | ICD-10-CM | POA: Insufficient documentation

## 2021-05-23 DIAGNOSIS — O26891 Other specified pregnancy related conditions, first trimester: Secondary | ICD-10-CM | POA: Insufficient documentation

## 2021-05-23 DIAGNOSIS — R55 Syncope and collapse: Secondary | ICD-10-CM | POA: Insufficient documentation

## 2021-05-23 DIAGNOSIS — Z3491 Encounter for supervision of normal pregnancy, unspecified, first trimester: Secondary | ICD-10-CM

## 2021-05-23 DIAGNOSIS — R61 Generalized hyperhidrosis: Secondary | ICD-10-CM | POA: Insufficient documentation

## 2021-05-23 DIAGNOSIS — R0602 Shortness of breath: Secondary | ICD-10-CM | POA: Diagnosis not present

## 2021-05-23 DIAGNOSIS — O99891 Other specified diseases and conditions complicating pregnancy: Secondary | ICD-10-CM | POA: Diagnosis not present

## 2021-05-23 LAB — URINALYSIS, MICROSCOPIC (REFLEX)

## 2021-05-23 LAB — COMPREHENSIVE METABOLIC PANEL
ALT: 12 U/L (ref 0–44)
AST: 18 U/L (ref 15–41)
Albumin: 3.4 g/dL — ABNORMAL LOW (ref 3.5–5.0)
Alkaline Phosphatase: 42 U/L (ref 38–126)
Anion gap: 6 (ref 5–15)
BUN: 8 mg/dL (ref 6–20)
CO2: 23 mmol/L (ref 22–32)
Calcium: 8.9 mg/dL (ref 8.9–10.3)
Chloride: 106 mmol/L (ref 98–111)
Creatinine, Ser: 0.32 mg/dL — ABNORMAL LOW (ref 0.44–1.00)
GFR, Estimated: >60 mL/min (ref >60)
Glucose, Bld: 86 mg/dL (ref 70–99)
Potassium: 3.6 mmol/L (ref 3.5–5.1)
Sodium: 135 mmol/L (ref 135–145)
Total Bilirubin: 0.4 mg/dL (ref 0.3–1.2)
Total Protein: 6.6 g/dL (ref 6.5–8.1)

## 2021-05-23 LAB — CBC WITH DIFFERENTIAL/PLATELET
Abs Immature Granulocytes: 0.06 10*3/uL (ref 0.00–0.07)
Basophils Absolute: 0 10*3/uL (ref 0.0–0.1)
Basophils Relative: 0 %
Eosinophils Absolute: 0 10*3/uL (ref 0.0–0.5)
Eosinophils Relative: 1 %
HCT: 31.8 % — ABNORMAL LOW (ref 36.0–46.0)
Hemoglobin: 10.4 g/dL — ABNORMAL LOW (ref 12.0–15.0)
Immature Granulocytes: 1 %
Lymphocytes Relative: 14 %
Lymphs Abs: 1 10*3/uL (ref 0.7–4.0)
MCH: 26.9 pg (ref 26.0–34.0)
MCHC: 32.7 g/dL (ref 30.0–36.0)
MCV: 82.2 fL (ref 80.0–100.0)
Monocytes Absolute: 0.9 10*3/uL (ref 0.1–1.0)
Monocytes Relative: 12 %
Neutro Abs: 5.2 10*3/uL (ref 1.7–7.7)
Neutrophils Relative %: 72 %
Platelets: 250 10*3/uL (ref 150–400)
RBC: 3.87 MIL/uL (ref 3.87–5.11)
RDW: 16 % — ABNORMAL HIGH (ref 11.5–15.5)
WBC: 7.2 10*3/uL (ref 4.0–10.5)
nRBC: 0 % (ref 0.0–0.2)

## 2021-05-23 LAB — URINALYSIS, ROUTINE W REFLEX MICROSCOPIC
Bilirubin Urine: NEGATIVE
Glucose, UA: NEGATIVE mg/dL
Hgb urine dipstick: NEGATIVE
Ketones, ur: NEGATIVE mg/dL
Nitrite: NEGATIVE
Protein, ur: NEGATIVE mg/dL
Specific Gravity, Urine: 1.015 (ref 1.005–1.030)
pH: 7 (ref 5.0–8.0)

## 2021-05-23 LAB — PREGNANCY, URINE: Preg Test, Ur: POSITIVE — AB

## 2021-05-23 MED ORDER — SODIUM CHLORIDE 0.9 % IV BOLUS
1000.0000 mL | Freq: Once | INTRAVENOUS | Status: AC
  Administered 2021-05-23: 1000 mL via INTRAVENOUS

## 2021-05-23 MED ORDER — ACETAMINOPHEN 325 MG PO TABS
650.0000 mg | ORAL_TABLET | Freq: Once | ORAL | Status: AC
  Administered 2021-05-23: 650 mg via ORAL
  Filled 2021-05-23: qty 2

## 2021-05-23 NOTE — ED Provider Notes (Signed)
[facility] EMERGENCY DEPARTMENT Provider Note   CSN: [id number] Arrival date & time: 05/23/21  8850     History Chief Complaint  Patient presents with   Near Syncope    Tammie Davis is a 20 y.o. female.  HPI 20 year old female presents with near syncope.  She was at work being trained and started to feel lightheaded and like her vision was going dark.  She was getting short of breath.  Started sweating.  Last about 20 minutes until they finally let her sit down.  She did feel better with sitting but when she got back up the symptoms recurred.  She also then after getting back up started to feel a headache.  Headache at its worst got to an 8 out of 10.  Headache is better now and is more like a 3 out of 10.  No vomiting or focal weakness.  Did never actually passed out.  She did eat breakfast this morning.  Did not feel like it was excessively hot at the work place.  Right now she felt okay walking in here.  No further lightheadedness.  She also is estimated to be around [redacted] weeks pregnant with her LMP 01/30/2021.  Has not seek any medical care.  No abdominal pain or vaginal bleeding.  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   OB History     Gravida  4   Para  0   Term      Preterm      AB  1   Living         SAB  1   IAB      Ectopic      Multiple      Live Births              Family History  Problem Relation Age of Onset   Asthma Sister     Social History   Tobacco Use   Smoking status: Never   Smokeless tobacco: Never  Vaping Use   Vaping Use: Never used  Substance Use Topics   Alcohol use: No   Drug use: No    Home Medications Prior to  Admission medications   Medication Sig Start Date End Date Taking? Authorizing Provider  famotidine (PEPCID) 20 MG tablet Take 1 tablet (20 mg total) by mouth daily. 03/09/20 04/08/20  Judeth Horn, NP  ondansetron (ZOFRAN ODT) 4 MG disintegrating tablet Take 1 tablet (4 mg total) by mouth every 8 (eight) hours as needed for nausea or vomiting. 03/09/20   Judeth Horn, NP    Allergies    Patient has no known allergies.  Review of Systems   Review of Systems  Constitutional:  Negative for fever.  Respiratory:  Positive for shortness of breath.   Cardiovascular:  Negative for chest pain.  Gastrointestinal:  Negative for abdominal pain, diarrhea and vomiting.  Genitourinary:  Negative for vaginal bleeding.  Neurological:  Positive for light-headedness and headaches. Negative for syncope, weakness and numbness.  All other systems reviewed and are negative.  Physical Exam Updated Vital Signs BP 111/65 (BP Location: Right Arm)   Pulse 88   Temp 98.4 F (36.9  C) (Oral)   Resp 18   Ht 5\' 2"  (1.575 m)   Wt 52.2 kg   LMP 01/30/2021 (Approximate)   SpO2 100%   BMI 21.03 kg/m   Physical Exam Vitals and nursing note reviewed.  Constitutional:      General: She is not in acute distress.    Appearance: She is well-developed. She is not ill-appearing or diaphoretic.  HENT:     Head: Normocephalic and atraumatic.     Right Ear: External ear normal.     Left Ear: External ear normal.     Nose: Nose normal.  Eyes:     General:        Right eye: No discharge.        Left eye: No discharge.     Extraocular Movements: Extraocular movements intact.     Pupils: Pupils are equal, round, and reactive to light.  Cardiovascular:     Rate and Rhythm: Normal rate and regular rhythm.     Heart sounds: Normal heart sounds.  Pulmonary:     Effort: Pulmonary effort is normal.     Breath sounds: Normal breath sounds.  Abdominal:     Palpations: Abdomen is soft.     Tenderness: There is no  abdominal tenderness.  Skin:    General: Skin is warm and dry.  Neurological:     Mental Status: She is alert.     Comments: CN 3-12 grossly intact. 5/5 strength in all 4 extremities. Grossly normal sensation. Normal finger to nose.   Psychiatric:        Mood and Affect: Mood is not anxious.    ED Results / Procedures / Treatments   Labs (all labs ordered are listed, but only abnormal results are displayed) Labs Reviewed  COMPREHENSIVE METABOLIC PANEL - Abnormal; Notable for the following components:      Result Value   Creatinine, Ser 0.32 (*)    Albumin 3.4 (*)    All other components within normal limits  CBC WITH DIFFERENTIAL/PLATELET - Abnormal; Notable for the following components:   Hemoglobin 10.4 (*)    HCT 31.8 (*)    RDW 16.0 (*)    All other components within normal limits  URINALYSIS, ROUTINE W REFLEX MICROSCOPIC - Abnormal; Notable for the following components:   Leukocytes,Ua TRACE (*)    All other components within normal limits  PREGNANCY, URINE - Abnormal; Notable for the following components:   Preg Test, Ur POSITIVE (*)    All other components within normal limits  URINALYSIS, MICROSCOPIC (REFLEX) - Abnormal; Notable for the following components:   Bacteria, UA RARE (*)    All other components within normal limits  URINE CULTURE    EKG EKG Interpretation  Date/Time:  Monday May 23 2021 10:07:49 EDT Ventricular Rate:  90 PR Interval:  120 QRS Duration: 85 QT Interval:  350 QTC Calculation: 429 R Axis:   41 Text Interpretation: Sinus rhythm no acute ST/T changes Confirmed by 11-23-2005 [phone]) on 05/23/2021 11:07:45 AM  Radiology No results found.  Procedures Procedures   Medications Ordered in ED Medications  sodium chloride 0.9 % bolus 1,000 mL (1,000 mLs Intravenous New Bag/Given 05/23/21 1024)  acetaminophen (TYLENOL) tablet 650 mg (650 mg Oral Given 05/23/21 1022)    ED Course  I have reviewed the triage vital signs and the  nursing notes.  Pertinent labs & imaging results that were available during my care of the patient were reviewed by me and considered in my  medical decision making (see chart for details).    MDM Rules/Calculators/A&P                          Patient is feeling much better.  Labs are unrevealing besides confirming her pregnancy.  However no abdominal pain, bleeding, etc. to suggest ectopic or other emergent pathology from pregnancy standpoint.  Probably from prolonged standing more than anything else.  Highly doubt acute CNS emergency or cardiac emergency.  She appears stable for discharge home to follow-up with PCP and OBGYN Final Clinical Impression(s) / ED Diagnoses Final diagnoses:  Near syncope  First trimester pregnancy    Rx / DC Orders ED Discharge Orders     None        Pricilla Loveless, MD 05/23/21 1138

## 2021-05-23 NOTE — ED Triage Notes (Addendum)
Approx 1 hr pta pt states was walking and had episode of "vision going black, sweating, and and shortness of breath", States frontal HA now.  Pt states is Pregnant LMP 01/30/2021, no prenatal care at this time

## 2021-05-24 ENCOUNTER — Telehealth: Payer: Self-pay

## 2021-05-24 LAB — URINE CULTURE

## 2021-05-24 NOTE — Telephone Encounter (Signed)
Transition Care Management Unsuccessful Follow-up Telephone Call  Date of discharge and from where:  05/23/2021 from [facility]  Attempts:  1st Attempt  Reason for unsuccessful TCM follow-up call:  Left voice message

## 2021-05-25 NOTE — Telephone Encounter (Signed)
Transition Care Management Unsuccessful Follow-up Telephone Call  Date of discharge and from where:  05/23/2021 from [facility]  Attempts:  2nd Attempt  Reason for unsuccessful TCM follow-up call:  Unable to reach patient

## 2021-05-26 NOTE — Telephone Encounter (Signed)
Transition Care Management Unsuccessful Follow-up Telephone Call  Date of discharge and from where:  05/23/2021 from [facility]  Attempts:  3rd Attempt  Reason for unsuccessful TCM follow-up call:  Unable to reach patient

## 2021-07-08 DIAGNOSIS — O3442 Maternal care for other abnormalities of cervix, second trimester: Secondary | ICD-10-CM | POA: Diagnosis not present

## 2021-07-08 DIAGNOSIS — O321XX0 Maternal care for breech presentation, not applicable or unspecified: Secondary | ICD-10-CM | POA: Diagnosis not present

## 2021-07-08 DIAGNOSIS — Z369 Encounter for antenatal screening, unspecified: Secondary | ICD-10-CM | POA: Diagnosis not present

## 2021-07-08 DIAGNOSIS — O09292 Supervision of pregnancy with other poor reproductive or obstetric history, second trimester: Secondary | ICD-10-CM | POA: Diagnosis not present

## 2021-07-08 DIAGNOSIS — O0932 Supervision of pregnancy with insufficient antenatal care, second trimester: Secondary | ICD-10-CM | POA: Diagnosis not present

## 2021-07-08 DIAGNOSIS — Z3689 Encounter for other specified antenatal screening: Secondary | ICD-10-CM | POA: Diagnosis not present

## 2021-07-08 DIAGNOSIS — O26872 Cervical shortening, second trimester: Secondary | ICD-10-CM | POA: Diagnosis not present

## 2021-07-08 DIAGNOSIS — O09212 Supervision of pregnancy with history of pre-term labor, second trimester: Secondary | ICD-10-CM | POA: Diagnosis not present

## 2021-07-08 DIAGNOSIS — Z3A19 19 weeks gestation of pregnancy: Secondary | ICD-10-CM | POA: Diagnosis not present

## 2021-07-09 DIAGNOSIS — O26872 Cervical shortening, second trimester: Secondary | ICD-10-CM | POA: Diagnosis not present

## 2021-07-09 DIAGNOSIS — Z3A19 19 weeks gestation of pregnancy: Secondary | ICD-10-CM | POA: Diagnosis not present

## 2021-07-09 DIAGNOSIS — O09292 Supervision of pregnancy with other poor reproductive or obstetric history, second trimester: Secondary | ICD-10-CM | POA: Diagnosis not present

## 2021-07-09 DIAGNOSIS — O321XX0 Maternal care for breech presentation, not applicable or unspecified: Secondary | ICD-10-CM | POA: Diagnosis not present

## 2021-07-09 DIAGNOSIS — O09212 Supervision of pregnancy with history of pre-term labor, second trimester: Secondary | ICD-10-CM | POA: Diagnosis not present

## 2021-07-10 DIAGNOSIS — O3432 Maternal care for cervical incompetence, second trimester: Secondary | ICD-10-CM | POA: Diagnosis not present

## 2021-07-10 DIAGNOSIS — O09212 Supervision of pregnancy with history of pre-term labor, second trimester: Secondary | ICD-10-CM | POA: Diagnosis not present

## 2021-07-10 DIAGNOSIS — O321XX0 Maternal care for breech presentation, not applicable or unspecified: Secondary | ICD-10-CM | POA: Diagnosis not present

## 2021-07-10 DIAGNOSIS — O09292 Supervision of pregnancy with other poor reproductive or obstetric history, second trimester: Secondary | ICD-10-CM | POA: Diagnosis not present

## 2021-07-10 DIAGNOSIS — O26872 Cervical shortening, second trimester: Secondary | ICD-10-CM | POA: Diagnosis not present

## 2021-07-10 DIAGNOSIS — Z3A19 19 weeks gestation of pregnancy: Secondary | ICD-10-CM | POA: Diagnosis not present

## 2021-08-10 DIAGNOSIS — O26872 Cervical shortening, second trimester: Secondary | ICD-10-CM | POA: Diagnosis not present

## 2021-08-10 DIAGNOSIS — Z3A23 23 weeks gestation of pregnancy: Secondary | ICD-10-CM | POA: Diagnosis not present

## 2021-08-10 DIAGNOSIS — O09292 Supervision of pregnancy with other poor reproductive or obstetric history, second trimester: Secondary | ICD-10-CM | POA: Diagnosis not present

## 2021-09-30 DIAGNOSIS — N898 Other specified noninflammatory disorders of vagina: Secondary | ICD-10-CM | POA: Diagnosis not present

## 2021-09-30 DIAGNOSIS — Z23 Encounter for immunization: Secondary | ICD-10-CM | POA: Diagnosis not present

## 2021-09-30 DIAGNOSIS — Z3689 Encounter for other specified antenatal screening: Secondary | ICD-10-CM | POA: Diagnosis not present

## 2021-09-30 DIAGNOSIS — O26893 Other specified pregnancy related conditions, third trimester: Secondary | ICD-10-CM | POA: Diagnosis not present

## 2021-09-30 DIAGNOSIS — Z8619 Personal history of other infectious and parasitic diseases: Secondary | ICD-10-CM | POA: Diagnosis not present

## 2021-09-30 DIAGNOSIS — Z3A31 31 weeks gestation of pregnancy: Secondary | ICD-10-CM | POA: Diagnosis not present

## 2021-10-27 LAB — OB RESULTS CONSOLE HIV ANTIBODY (ROUTINE TESTING): HIV: NONREACTIVE

## 2021-10-27 LAB — OB RESULTS CONSOLE GBS: GBS: NEGATIVE

## 2021-11-03 ENCOUNTER — Inpatient Hospital Stay (HOSPITAL_COMMUNITY)
Admission: AD | Admit: 2021-11-03 | Discharge: 2021-11-05 | DRG: 805 | Disposition: A | Discharge status: Home / Self Care | Payer: Medicaid Other | Attending: Obstetrics & Gynecology | Admitting: Obstetrics & Gynecology

## 2021-11-03 ENCOUNTER — Inpatient Hospital Stay (HOSPITAL_COMMUNITY): Payer: Medicaid Other | Admitting: Anesthesiology

## 2021-11-03 ENCOUNTER — Other Ambulatory Visit: Payer: Self-pay

## 2021-11-03 ENCOUNTER — Encounter (HOSPITAL_COMMUNITY): Payer: Self-pay | Admitting: Family Medicine

## 2021-11-03 DIAGNOSIS — Z9889 Other specified postprocedural states: Secondary | ICD-10-CM

## 2021-11-03 DIAGNOSIS — O411230 Chorioamnionitis, third trimester, not applicable or unspecified: Secondary | ICD-10-CM | POA: Diagnosis not present

## 2021-11-03 DIAGNOSIS — O9852 Other viral diseases complicating childbirth: Secondary | ICD-10-CM | POA: Diagnosis not present

## 2021-11-03 DIAGNOSIS — O3433 Maternal care for cervical incompetence, third trimester: Secondary | ICD-10-CM | POA: Diagnosis not present

## 2021-11-03 DIAGNOSIS — Z3A36 36 weeks gestation of pregnancy: Secondary | ICD-10-CM

## 2021-11-03 DIAGNOSIS — U071 COVID-19: Secondary | ICD-10-CM | POA: Diagnosis present

## 2021-11-03 DIAGNOSIS — O479 False labor, unspecified: Secondary | ICD-10-CM | POA: Diagnosis not present

## 2021-11-03 DIAGNOSIS — Z3689 Encounter for other specified antenatal screening: Secondary | ICD-10-CM | POA: Diagnosis not present

## 2021-11-03 DIAGNOSIS — O98513 Other viral diseases complicating pregnancy, third trimester: Secondary | ICD-10-CM | POA: Diagnosis not present

## 2021-11-03 DIAGNOSIS — O4700 False labor before 37 completed weeks of gestation, unspecified trimester: Secondary | ICD-10-CM

## 2021-11-03 DIAGNOSIS — O2293 Venous complication in pregnancy, unspecified, third trimester: Secondary | ICD-10-CM | POA: Diagnosis not present

## 2021-11-03 LAB — CBC
HCT: 33.1 % — ABNORMAL LOW (ref 36.0–46.0)
Hemoglobin: 9.6 g/dL — ABNORMAL LOW (ref 12.0–15.0)
MCH: 21.5 pg — ABNORMAL LOW (ref 26.0–34.0)
MCHC: 29 g/dL — ABNORMAL LOW (ref 30.0–36.0)
MCV: 74.2 fL — ABNORMAL LOW (ref 80.0–100.0)
Platelets: 267 10*3/uL (ref 150–400)
RBC: 4.46 MIL/uL (ref 3.87–5.11)
RDW: 26.8 % — ABNORMAL HIGH (ref 11.5–15.5)
WBC: 12.5 10*3/uL — ABNORMAL HIGH (ref 4.0–10.5)
nRBC: 0 % (ref 0.0–0.2)

## 2021-11-03 LAB — TYPE AND SCREEN
ABO/RH(D): A POS
Antibody Screen: NEGATIVE

## 2021-11-03 LAB — OB RESULTS CONSOLE GC/CHLAMYDIA
Chlamydia: NEGATIVE
Gonorrhea: NEGATIVE

## 2021-11-03 LAB — RESP PANEL BY RT-PCR (FLU A&B, COVID) ARPGX2
Influenza A by PCR: NEGATIVE
Influenza B by PCR: NEGATIVE
SARS Coronavirus 2 by RT PCR: POSITIVE — AB

## 2021-11-03 LAB — OB RESULTS CONSOLE RPR: RPR: NONREACTIVE

## 2021-11-03 MED ORDER — PHENYLEPHRINE 40 MCG/ML (10ML) SYRINGE FOR IV PUSH (FOR BLOOD PRESSURE SUPPORT): 80.0000 ug | PREFILLED_SYRINGE | INTRAVENOUS | Status: DC | PRN

## 2021-11-03 MED ORDER — FENTANYL CITRATE (PF) 100 MCG/2ML IJ SOLN: 50.0000 ug | INTRAMUSCULAR | Status: DC | PRN

## 2021-11-03 MED ORDER — LIDOCAINE HCL (PF) 1 % IJ SOLN: 30.0000 mL | INTRAMUSCULAR | Status: DC | PRN

## 2021-11-03 MED ORDER — TRANEXAMIC ACID-NACL 1000-0.7 MG/100ML-% IV SOLN: 1000.0000 mg | Freq: Once | INTRAVENOUS | Status: DC

## 2021-11-03 MED ORDER — FENTANYL-BUPIVACAINE-NACL 0.5-0.125-0.9 MG/250ML-% EP SOLN
EPIDURAL | Status: AC
  Filled 2021-11-03: qty 250

## 2021-11-03 MED ORDER — ONDANSETRON HCL 4 MG/2ML IJ SOLN: 4.0000 mg | Freq: Four times a day (QID) | INTRAMUSCULAR | Status: DC | PRN

## 2021-11-03 MED ORDER — EPHEDRINE 5 MG/ML INJ: 10.0000 mg | INTRAVENOUS | Status: DC | PRN

## 2021-11-03 MED ORDER — LACTATED RINGERS IV SOLN: INTRAVENOUS | Status: DC

## 2021-11-03 MED ORDER — NIFEDIPINE 10 MG PO CAPS
10.0000 mg | ORAL_CAPSULE | Freq: Once | ORAL | Status: DC
  Filled 2021-11-03: qty 1

## 2021-11-03 MED ORDER — LACTATED RINGERS IV SOLN: 500.0000 mL | INTRAVENOUS | Status: DC | PRN

## 2021-11-03 MED ORDER — OXYTOCIN BOLUS FROM INFUSION
333.0000 mL | Freq: Once | INTRAVENOUS | Status: AC
  Administered 2021-11-03: 333 mL via INTRAVENOUS

## 2021-11-03 MED ORDER — OXYTOCIN-SODIUM CHLORIDE 30-0.9 UT/500ML-% IV SOLN
2.5000 [IU]/h | INTRAVENOUS | Status: DC
  Administered 2021-11-03: 2.5 [IU]/h via INTRAVENOUS
  Filled 2021-11-03: qty 500

## 2021-11-03 MED ORDER — LIDOCAINE HCL (PF) 1 % IJ SOLN
INTRAMUSCULAR | Status: DC | PRN
  Administered 2021-11-03: 4 mL via EPIDURAL
  Administered 2021-11-03: 5 mL via EPIDURAL

## 2021-11-03 MED ORDER — TRANEXAMIC ACID-NACL 1000-0.7 MG/100ML-% IV SOLN
INTRAVENOUS | Status: AC
  Administered 2021-11-03: 1000 mg
  Filled 2021-11-03: qty 100

## 2021-11-03 MED ORDER — SOD CITRATE-CITRIC ACID 500-334 MG/5ML PO SOLN: 30.0000 mL | ORAL | Status: DC | PRN

## 2021-11-03 MED ORDER — SODIUM CHLORIDE 0.9 % IV SOLN
2.0000 g | Freq: Once | INTRAVENOUS | Status: AC
  Administered 2021-11-03: 2 g via INTRAVENOUS
  Filled 2021-11-03: qty 2000

## 2021-11-03 MED ORDER — SODIUM CHLORIDE 0.9 % IV SOLN: 1.0000 g | INTRAVENOUS | Status: DC

## 2021-11-03 MED ORDER — OXYCODONE-ACETAMINOPHEN 5-325 MG PO TABS: 1.0000 | ORAL_TABLET | ORAL | Status: DC | PRN

## 2021-11-03 MED ORDER — LACTATED RINGERS IV SOLN
500.0000 mL | Freq: Once | INTRAVENOUS | Status: AC
  Administered 2021-11-03: 500 mL via INTRAVENOUS

## 2021-11-03 MED ORDER — FENTANYL-BUPIVACAINE-NACL 0.5-0.125-0.9 MG/250ML-% EP SOLN
12.0000 mL/h | EPIDURAL | Status: DC | PRN
  Administered 2021-11-03: 12 mL/h via EPIDURAL

## 2021-11-03 MED ORDER — OXYCODONE-ACETAMINOPHEN 5-325 MG PO TABS: 2.0000 | ORAL_TABLET | ORAL | Status: DC | PRN

## 2021-11-03 MED ORDER — OXYCODONE-ACETAMINOPHEN 5-325 MG PO TABS
2.0000 | ORAL_TABLET | Freq: Once | ORAL | Status: AC
  Administered 2021-11-03: 2 via ORAL
  Filled 2021-11-03: qty 2

## 2021-11-03 MED ORDER — ACETAMINOPHEN 325 MG PO TABS: 650.0000 mg | ORAL_TABLET | ORAL | Status: DC | PRN

## 2021-11-03 MED ORDER — DIPHENHYDRAMINE HCL 50 MG/ML IJ SOLN: 12.5000 mg | INTRAMUSCULAR | Status: DC | PRN

## 2021-11-03 NOTE — Anesthesia Procedure Notes (Signed)
Epidural Patient location during procedure: OB Start time: 11/03/2021 7:55 PM End time: 11/03/2021 7:59 PM  Staffing Anesthesiologist: Beryle Lathe, MD Performed: anesthesiologist   Preanesthetic Checklist Completed: patient identified, IV checked, risks and benefits discussed, monitors and equipment checked, pre-op evaluation and timeout performed  Epidural Patient position: sitting Prep: DuraPrep Patient monitoring: continuous pulse ox and blood pressure Approach: midline Location: L2-L3 Injection technique: LOR saline  Needle:  Needle type: Tuohy  Needle gauge: 17 G Needle length: 9 cm Needle insertion depth: 5 cm Catheter size: 19 Gauge Catheter at skin depth: 10 cm Test dose: negative and Other (1% lidocaine)  Assessment Events: blood not aspirated  Additional Notes Patient identified. Risks including, but not limited to, bleeding, infection, nerve damage, paralysis, inadequate analgesia, blood pressure changes, nausea, vomiting, allergic reaction, postpartum back pain, itching, and headache were discussed. Patient expressed understanding and wished to proceed. Sterile prep and drape, including hand hygiene, mask, and sterile gloves were used. The patient was positioned and the spine was prepped. The skin was anesthetized with lidocaine. No paraesthesia or other complication noted. The patient did not experience any signs of intravascular injection such as tinnitus or metallic taste in mouth, nor signs of intrathecal spread such as rapid motor block. Please see nursing notes for vital signs. The patient tolerated the procedure well.   Leslye Peer, MDReason for block:procedure for pain

## 2021-11-03 NOTE — Anesthesia Preprocedure Evaluation (Signed)
Anesthesia Evaluation  Patient identified by MRN, date of birth, ID band Patient awake    Reviewed: Allergy & Precautions, NPO status , Patient's Chart, lab work & pertinent test results  History of Anesthesia Complications Negative for: history of anesthetic complications  Airway Mallampati: II   Neck ROM: Full    Dental   Pulmonary neg pulmonary ROS,    Pulmonary exam normal        Cardiovascular negative cardio ROS Normal cardiovascular exam     Neuro/Psych  Headaches, negative psych ROS   GI/Hepatic negative GI ROS, Neg liver ROS,   Endo/Other  negative endocrine ROS  Renal/GU negative Renal ROS     Musculoskeletal negative musculoskeletal ROS (+)   Abdominal   Peds  Hematology negative hematology ROS (+)  Plt 267k    Anesthesia Other Findings   Reproductive/Obstetrics (+) Pregnancy                             Anesthesia Physical Anesthesia Plan  ASA: 2  Anesthesia Plan: Epidural   Post-op Pain Management:    Induction:   PONV Risk Score and Plan: 2 and Treatment may vary due to age or medical condition  Airway Management Planned: Natural Airway  Additional Equipment: None  Intra-op Plan:   Post-operative Plan:   Informed Consent: I have reviewed the patients History and Physical, chart, labs and discussed the procedure including the risks, benefits and alternatives for the proposed anesthesia with the patient or authorized representative who has indicated his/her understanding and acceptance.       Plan Discussed with: Anesthesiologist  Anesthesia Plan Comments: (Labs reviewed. Platelets acceptable, patient not taking any blood thinning medications. Per RN, FHR tracing reported to be stable enough for sitting procedure. Risks and benefits discussed with patient, including PDPH, backache, epidural hematoma, failed epidural, blood pressure changes, allergic  reaction, and nerve injury. Patient expressed understanding and wished to proceed.)        Anesthesia Quick Evaluation

## 2021-11-03 NOTE — Progress Notes (Signed)
Dr. Crissie Reese remains @ bedside talking to pt's family>  MD will give Percocet & Procardia.  Pt & family verbalized understanding and agreeable with POC.

## 2021-11-03 NOTE — Discharge Summary (Signed)
Postpartum Discharge Summary  Date of Service updated     Patient Name: Tammie Davis DOB: [birth year] MRN: [id number]  Date of admission: 11/03/2021 Delivery date:11/03/2021  Delivering provider: Christin Fudge  Date of discharge: 11/05/2021  Admitting diagnosis: Indication for care in labor and delivery, antepartum [O75.9] Intrauterine pregnancy: [redacted]w[redacted]d    Secondary diagnosis:  Principal Problem:   Indication for care in labor and delivery, antepartum Active Problems:   History of cervical cerclage   Preterm labor in third trimester with preterm delivery   Lab test positive for detection of COVID-19 virus  Additional problems:     Discharge diagnosis: Preterm Pregnancy Delivered                                              Post partum procedures: None  Augmentation: none Complications: None  Hospital course: Onset of Labor With Vaginal Delivery      20y.o. yo G2P0100 at 343w1das admitted in Latent Labor on 11/03/2021. Patient had an uncomplicated labor course as follows:  Membrane Rupture Time/Date: 10:18 PM ,11/03/2021   Delivery Method:Vaginal, Spontaneous  Episiotomy: None  Lacerations:  2nd degree;Perineal  Patient had an uncomplicated postpartum course.  She is ambulating, tolerating a regular diet, passing flatus, and urinating well. Patient is discharged home in stable condition on 11/05/21.  Newborn Data: Birth date:11/03/2021  Birth time:10:41 PM  Gender:Female  Living status:Living  Apgars:8 ,9  Weight:2890 g   Magnesium Sulfate received: No BMZ received: No Rhophylac:N/A MMR:N/A T-DaP:Given prenatally Flu: No Transfusion:No  Physical exam  Vitals:   11/04/21 1500 11/04/21 2029 11/05/21 0500 11/05/21 1355  BP: 111/63 106/62 (!) 114/59 116/61  Pulse: 81 92 90 77  Resp: _0 Temp: 98.6 F (37 C) 99.2 F (37.3 C)  98.7 F (37.1 C)  TempSrc: Oral Oral  Oral  SpO2: 100%   100%  Weight:      Height:       General: alert,  cooperative, and no distress Lochia: appropriate Uterine Fundus: firm Incision: N/A DVT Evaluation: No significant calf/ankle edema. Labs: Lab Results  Component Value Date   WBC 12.5 (H) 11/03/2021   HGB 9.6 (L) 11/03/2021   HCT 33.1 (L) 11/03/2021   MCV 74.2 (L) 11/03/2021   PLT 267 11/03/2021   CMP Latest Ref Rng & Units 05/23/2021  Glucose 70 - 99 mg/dL 86  BUN 6 - 20 mg/dL 8  Creatinine 0.44 - 1.00 mg/dL 0.32(L)  Sodium 135 - 145 mmol/L 135  Potassium 3.5 - 5.1 mmol/L 3.6  Chloride 98 - 111 mmol/L 106  CO2 22 - 32 mmol/L 23  Calcium 8.9 - 10.3 mg/dL 8.9  Total Protein 6.5 - 8.1 g/dL 6.6  Total Bilirubin 0.3 - 1.2 mg/dL 0.4  Alkaline Phos 38 - 126 U/L 42  AST 15 - 41 U/L 18  ALT 0 - 44 U/L 12   Edinburgh Score: Edinburgh Postnatal Depression Scale Screening Tool 11/04/2021  I have been able to laugh and see the funny side of things. 0  I have looked forward with enjoyment to things. 0  I have blamed myself unnecessarily when things went wrong. 1  I have been anxious or worried for no good reason. 2  I have felt scared or panicky for no good reason. 0  Things have been getting on  top of me. 0  I have been so unhappy that I have had difficulty sleeping. 0  I have felt sad or miserable. 0  I have been so unhappy that I have been crying. 0  The thought of harming myself has occurred to me. 0  Edinburgh Postnatal Depression Scale Total 3     After visit meds:  Allergies as of 11/05/2021   No Known Allergies      Medication List     TAKE these medications    acetaminophen 325 MG tablet Commonly known as: Tylenol Take 2 tablets (650 mg total) by mouth every 4 (four) hours as needed (for pain scale < 4).   ibuprofen 600 MG tablet Commonly known as: ADVIL Take 1 tablet (600 mg total) by mouth every 6 (six) hours as needed.         Discharge home in stable condition Infant Feeding: Bottle Infant Disposition:home with mother Discharge instruction: per  After Visit Summary and Postpartum booklet. Activity: Advance as tolerated. Pelvic rest for 6 weeks.  Diet: routine diet Future Appointments:No future appointments. Follow up Visit:   Please schedule this patient for a In person postpartum visit in 4 weeks with the following provider: Any provider. Additional Postpartum F/U:  Low risk pregnancy complicated by:  incompetent cx Delivery mode:  Vaginal, Spontaneous  Anticipated Birth Control:  PP Depo given    11/05/2021  N , DO    

## 2021-11-03 NOTE — Progress Notes (Signed)
EFM removed, pt to ambulate in room.

## 2021-11-03 NOTE — Progress Notes (Signed)
Dopplered x2 minutes

## 2021-11-03 NOTE — MAU Note (Signed)
Tammie Davis is a 20 y.o. at [redacted]w[redacted]d here in MAU reporting: had 36wk appointment and had cerclage removed and said she was 5.5cm. States no contractions. No bleeding or LOF. +FM  Onset of complaint: today  Pain score: 0/10  Vitals:   11/03/21 1249  BP: 113/62  Pulse: 87  Resp: 16  Temp: 98.7 F (37.1 C)  SpO2: 100%     FHT:128  Lab orders placed from triage: none

## 2021-11-03 NOTE — Progress Notes (Signed)
RN in room to review discharge instructions and discharge home, pt's sister on speaker phone with grandmother & grandmother stated pt needs to remain in hospital for 24 hours and shouldn't be sent home due to pain.  Grandmother told nurse to administer pt medication now.  RN informed pt's grandmother that an order from the MD is required prior to medication administration, grandmother responded "you have a standing order to give Tylenol", RN stated I don't have a standing order, but I will speak to the physician.

## 2021-11-03 NOTE — Progress Notes (Signed)
Dr. Crissie Reese into room to speak to pt & family regarding concerns about discharge and medication.

## 2021-11-03 NOTE — H&P (Addendum)
OBSTETRIC ADMISSION HISTORY AND PHYSICAL  Tammie Davis is a 20 y.o. female G66P0100 with IUP at 52w1dby early [place] pre[place] labor. She was seen by her Ob/gyn earlier today for cerclage removal, after removal she quickly made cervical change so they recommended she present to L&D. She reports +FMs, No LOF, no VB, no blurry vision, headaches or peripheral edema, and RUQ pain.  She plans on bottle feeding. She request depo for birth control. [facility] [place]  Estimated Date of Delivery: 11/30/21  Prenatal History/Complications:  --History of incompetent cervix, s/p cerclage, removed today 12/8    Past Medical History: Past Medical History:  Diagnosis Date   Eczema    Migraines    Past Surgical History: Past Surgical History:  Procedure Laterality Date   CERVICAL CERCLAGE      Obstetrical History: OB History     Gravida  2   Para  1   Term      Preterm  1   AB  0   Living  0      SAB  0   IAB      Ectopic      Multiple      Live Births  0           Social History Social History   Socioeconomic History   Marital status: Single    Spouse name: Not on file   Number of children: Not on file   Years of education: Not on file   Highest education level: Not on file  Occupational History   Not on file  Tobacco Use   Smoking status: Never   Smokeless tobacco: Never  Vaping Use   Vaping Use: Never used  Substance and Sexual Activity   Alcohol use: No   Drug use: No   Sexual activity: Yes    Birth control/protection: None  Other Topics Concern   Not on file  Social History Narrative   Lives with Dad.  Parents separated   Social Determinants of HRadio broadcast assistantStrain: Not on file  Food Insecurity: Not on file  Transportation Needs: Not on file  Physical Activity: Not on file  Stress: Not on file  Social Connections: Not on file    Family History: Family History   Problem Relation Age of Onset   Asthma Sister     Allergies: No Known Allergies  Medications Prior to Admission  Medication Sig Dispense Refill Last Dose   famotidine (PEPCID) 20 MG tablet Take 1 tablet (20 mg total) by mouth daily. 30 tablet 0    ondansetron (ZOFRAN ODT) 4 MG disintegrating tablet Take 1 tablet (4 mg total) by mouth every 8 (eight) hours as needed for nausea or vomiting. 15 tablet 0      Review of Systems   All systems reviewed and negative except as stated in HPI  Blood pressure 133/64, pulse 79, temperature 98.1 F (36.7 C), temperature source Oral, resp. rate 18, height 5' 2"  (1.575 m), weight 70.8 kg, last menstrual period 01/30/2021, SpO2 100 %, unknown if currently breastfeeding. General appearance: alert and cooperative Lungs: clear to auscultation bilaterally Heart: regular rate and rhythm Abdomen: soft, non-tender; bowel sounds normal Pelvic: @ 8:01 PM  9/10/0/BBOW Extremities: Homans sign is negative, no sign of DVT DTR's 2+ Presentation: cephalic Fetal monitoringBaseline: 135 bpm, Variability: Good {> 6 bpm), Accelerations: Reactive, and Decelerations: Absent Uterine activity  every 3-4 min Dilation: 7 Effacement (%): 80 Station: -2 Exam by:: Truitt Leep, RNC   Prenatal labs: ABO, Rh: --/--/PENDING (12/08 1902) Antibody: PENDING (12/08 1902) Rubella:  Immune RPR:   Non-reactive HBsAg:   Non-reactive HIV:   Non-reactive GBS:   Unknown  1 hr Glucola passed Genetic screening AFP screen negative  Anatomy US normal per patient   Prenatal Transfer Tool  Maternal Diabetes: No Genetic Screening: Normal Maternal Ultrasounds/Referrals: Normal Fetal Ultrasounds or other Referrals:  None Maternal Substance Abuse:  No Significant Maternal Medications:  None Significant Maternal Lab Results: None  Results for orders placed or performed during the hospital encounter of 11/03/21 (from the past 24 hour(s))  CBC   Collection Time: 11/03/21  7:02  PM  Result Value Ref Range   WBC 12.5 (H) 4.0 - 10.5 K/uL   RBC 4.46 3.87 - 5.11 MIL/uL   Hemoglobin 9.6 (L) 12.0 - 15.0 g/dL   HCT 33.1 (L) 36.0 - 46.0 %   MCV 74.2 (L) 80.0 - 100.0 fL   MCH 21.5 (L) 26.0 - 34.0 pg   MCHC 29.0 (L) 30.0 - 36.0 g/dL   RDW 26.8 (H) 11.5 - 15.5 %   Platelets 267 150 - 400 K/uL   nRBC 0.0 0.0 - 0.2 %  Type and screen [place]   Collection Time: 11/03/21  7:02 PM  Result Value Ref Range   ABO/RH(D) PENDING    Antibody Screen PENDING    Sample Expiration      11/06/2021,2359 Performed at [facility], 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 G2P0100 at 16w1dhere for pre-term labor.   #Labor: Changed from 5>7cm in the MAU with increasing pressure, plan for expectant management and anticipate vaginal delivery shortly.   #COVID + on admission. Asymptomatic.   #Pain:  #FWB: Cat 1  #ID: GBS unknown, Amp ordered,  culture collected #MOF: Bottle #MOC:Depo #Circ: Yes  CCharlton Haws MD PGY-2  I personally saw and evaluated the patient, performing the key elements of the service. I developed and verified the management plan that is described in the resident's/student's note, and I agree with the content with my edits above. VSS, HRR&R, Resp unlabored, Legs neg.  FNigel Berthold CNM 11/03/2021 11:34 PM

## 2021-11-04 ENCOUNTER — Encounter (HOSPITAL_COMMUNITY): Payer: Self-pay | Admitting: Obstetrics & Gynecology

## 2021-11-04 LAB — RPR: RPR Ser Ql: NONREACTIVE

## 2021-11-04 MED ORDER — BENZOCAINE-MENTHOL 20-0.5 % EX AERO
1.0000 "application " | INHALATION_SPRAY | CUTANEOUS | Status: DC | PRN
  Administered 2021-11-05: 1 via TOPICAL
  Filled 2021-11-04: qty 56

## 2021-11-04 MED ORDER — IBUPROFEN 600 MG PO TABS
600.0000 mg | ORAL_TABLET | Freq: Four times a day (QID) | ORAL | Status: DC
  Administered 2021-11-04 – 2021-11-05 (×7): 600 mg via ORAL
  Filled 2021-11-04 (×7): qty 1

## 2021-11-04 MED ORDER — TETANUS-DIPHTH-ACELL PERTUSSIS 5-2.5-18.5 LF-MCG/0.5 IM SUSY: 0.5000 mL | PREFILLED_SYRINGE | Freq: Once | INTRAMUSCULAR | Status: DC

## 2021-11-04 MED ORDER — SIMETHICONE 80 MG PO CHEW: 80.0000 mg | CHEWABLE_TABLET | ORAL | Status: DC | PRN

## 2021-11-04 MED ORDER — PRENATAL MULTIVITAMIN CH
1.0000 | ORAL_TABLET | Freq: Every day | ORAL | Status: DC
  Administered 2021-11-04 – 2021-11-05 (×2): 1 via ORAL
  Filled 2021-11-04 (×2): qty 1

## 2021-11-04 MED ORDER — DIBUCAINE (PERIANAL) 1 % EX OINT: 1.0000 "application " | TOPICAL_OINTMENT | CUTANEOUS | Status: DC | PRN

## 2021-11-04 MED ORDER — ONDANSETRON HCL 4 MG/2ML IJ SOLN: 4.0000 mg | INTRAMUSCULAR | Status: DC | PRN

## 2021-11-04 MED ORDER — ONDANSETRON HCL 4 MG PO TABS: 4.0000 mg | ORAL_TABLET | ORAL | Status: DC | PRN

## 2021-11-04 MED ORDER — SENNOSIDES-DOCUSATE SODIUM 8.6-50 MG PO TABS
2.0000 | ORAL_TABLET | ORAL | Status: DC
  Administered 2021-11-04 – 2021-11-05 (×2): 2 via ORAL
  Filled 2021-11-04 (×2): qty 2

## 2021-11-04 MED ORDER — WITCH HAZEL-GLYCERIN EX PADS
1.0000 "application " | MEDICATED_PAD | CUTANEOUS | Status: DC | PRN
  Administered 2021-11-05: 1 via TOPICAL

## 2021-11-04 MED ORDER — MEASLES, MUMPS & RUBELLA VAC IJ SOLR: 0.5000 mL | Freq: Once | INTRAMUSCULAR | Status: DC

## 2021-11-04 MED ORDER — DIPHENHYDRAMINE HCL 25 MG PO CAPS: 25.0000 mg | ORAL_CAPSULE | Freq: Four times a day (QID) | ORAL | Status: DC | PRN

## 2021-11-04 MED ORDER — ACETAMINOPHEN 325 MG PO TABS: 650.0000 mg | ORAL_TABLET | ORAL | Status: DC | PRN

## 2021-11-04 MED ORDER — COCONUT OIL OIL: 1.0000 "application " | TOPICAL_OIL | Status: DC | PRN

## 2021-11-04 NOTE — Anesthesia Postprocedure Evaluation (Signed)
Anesthesia Post Note  Patient: Tammie Davis  Procedure(s) Performed: AN AD HOC LABOR EPIDURAL     Patient location during evaluation: Mother Baby Anesthesia Type: Epidural Level of consciousness: awake and alert and oriented Pain management: satisfactory to patient Vital Signs Assessment: post-procedure vital signs reviewed and stable Respiratory status: respiratory function stable Cardiovascular status: stable Postop Assessment: no headache, no backache, epidural receding, patient able to bend at knees, no signs of nausea or vomiting, adequate PO intake and able to ambulate Anesthetic complications: no   No notable events documented.  Last Vitals:  Vitals:   11/04/21 0627 11/04/21 0945  BP: (!) 102/54 (!) 106/50  Pulse: 75 75  Resp: 16 18  Temp: 37.4 C 37.3 C  SpO2:  100%    Last Pain:  Vitals:   11/04/21 0945  TempSrc: Oral  PainSc: 0-No pain   Pain Goal:                   Cullen Lahaie

## 2021-11-04 NOTE — Progress Notes (Signed)
POSTPARTUM PROGRESS NOTE  Subjective: Tammie Davis is a 20 y.o. [id number] s/p SVD at [redacted]w[redacted]d.  She reports she doing well. No acute events overnight. She denies any problems with ambulating, voiding or po intake. Denies nausea or vomiting. She has passed flatus. Pain is well controlled.  Lochia is minimal.  Objective: Blood pressure (!) 102/54, pulse 75, temperature 99.4 F (37.4 C), temperature source Oral, resp. rate 16, height 5\' 2"  (1.575 m), weight 70.8 kg, last menstrual period 01/30/2021, SpO2 98 %, unknown if currently breastfeeding.  Physical Exam:  General: alert, cooperative and no distress Chest: no respiratory distress Abdomen: soft, non-tender  Uterine Fundus: firm and at level of umbilicus Extremities: No calf swelling or tenderness  no edema  Recent Labs    11/03/21 1902  HGB 9.6*  HCT 33.1*    Assessment/Plan: Tammie Davis is a 20 y.o. 26 s/p SVD at [redacted]w[redacted]d for SOL.  Routine Postpartum Care: Doing well, pain well-controlled.  -- Continue routine care, lactation support  -- Contraception: depo -- Feeding: bottle  Dispo: Plan for discharge home tomorrow.  [redacted]w[redacted]d, MD PGY-2 11/04/2021 8:38 AM

## 2021-11-04 NOTE — Progress Notes (Signed)
MOB was referred for history of depression/anxiety. * Referral screened out by Clinical Social Worker because none of the following criteria appear to apply: ~ History of anxiety/depression during this pregnancy, or of post-partum depression following prior delivery. ~ Diagnosis of anxiety and/or depression within last 3 years. No concerns noted in OB records. OR * MOB's symptoms currently being treated with medication and/or therapy.  Please contact the Clinical Social Worker if needs arise, by MOB request, or if MOB scores greater than 9/yes to question 10 on Edinburgh Postpartum Depression Screen.   Traeson Dusza Boyd-Gilyard, MSW, LCSW Clinical Social Work [phone]  

## 2021-11-04 NOTE — Progress Notes (Signed)
Circumcision Consent  Discussed with mom at bedside about circumcision.   Circumcision is a surgery that removes the skin that covers the tip of the penis, called the "foreskin." Circumcision is usually done when a boy is between 1 and 10 days old, sometimes up to 3-4 weeks old.  The most common reasons boys are circumcised include for cultural/religious beliefs or for parental preference (potentially easier to clean, so baby looks like daddy, etc).  There may be some medical benefits for circumcision:   Circumcised boys seem to have slightly lower rates of: ? Urinary tract infections (per the American Academy of Pediatrics an uncircumcised boy has a 1/100 chance of developing a UTI in the first year of life, a circumcised boy at a 11/998 chance of developing a UTI in the first year of life- a 10% reduction) ? Penis cancer (typically rare- an uncircumcised female has a 1 in 100,000 chance of developing cancer of the penis) ? Sexually transmitted infection (in endemic areas, including HIV, HPV and Herpes- circumcision does NOT protect against gonorrhea, chlamydia, trachomatis, or syphilis) ? Phimosis: a condition where that makes retraction of the foreskin over the glans impossible (0.4 per 1000 boys per year or 0.6% of boys are affected by their 15th birthday)  Boys and men who are not circumcised can reduce these extra risks by: ? Cleaning their penis well ? Using condoms during sex  What are the risks of circumcision?  As with any surgical procedure, there are risks and complications. In circumcision, complications are rare and usually minor, the most common being: ? Bleeding- risk is reduced by holding each clamp for 30 seconds prior to a cut being made, and by holding pressure after the procedure is done ? Infection- the penis is cleaned prior to the procedure, and the procedure is done under sterile technique ? Damage to the urethra or amputation of the penis  How is circumcision done  in baby boys?  The baby will be placed on a special table and the legs restrained for their safety. Numbing medication is injected into the penis, and the skin is cleansed with betadine to decrease the risk of infection.   All questions were answered and mother consented.  Leontae Bostock, DO Attending Obstetrician & Gynecologist, [facility], [facility]   

## 2021-11-05 ENCOUNTER — Encounter (HOSPITAL_COMMUNITY): Payer: Self-pay | Admitting: Obstetrics & Gynecology

## 2021-11-05 DIAGNOSIS — U071 COVID-19: Secondary | ICD-10-CM

## 2021-11-05 DIAGNOSIS — Z9889 Other specified postprocedural states: Secondary | ICD-10-CM

## 2021-11-05 LAB — CULTURE, BETA STREP (GROUP B ONLY)

## 2021-11-05 MED ORDER — IBUPROFEN 600 MG PO TABS: 600.0000 mg | ORAL_TABLET | Freq: Four times a day (QID) | ORAL | 0 refills | Status: AC | PRN

## 2021-11-05 MED ORDER — ACETAMINOPHEN 325 MG PO TABS: 650.0000 mg | ORAL_TABLET | ORAL | Status: AC | PRN

## 2021-11-05 MED ORDER — MEDROXYPROGESTERONE ACETATE 150 MG/ML IM SUSP
150.0000 mg | Freq: Once | INTRAMUSCULAR | Status: AC
  Administered 2021-11-05: 150 mg via INTRAMUSCULAR
  Filled 2021-11-05: qty 1

## 2021-11-07 ENCOUNTER — Telehealth: Payer: Self-pay

## 2021-11-07 LAB — SURGICAL PATHOLOGY

## 2021-11-07 NOTE — Telephone Encounter (Signed)
.  Transition Care Management Unsuccessful Follow-up Telephone Call  Date of discharge and from where:  11/05/2021-[facility]  Attempts:  1st Attempt  Reason for unsuccessful TCM follow-up call:  Unable to reach patient

## 2021-11-08 NOTE — Telephone Encounter (Signed)
Transition Care Management Unsuccessful Follow-up Telephone Call  Date of discharge and from where:  11/05/2021 from [facility]  Attempts:  2nd Attempt  Reason for unsuccessful TCM follow-up call:  Unable to leave message

## 2021-11-09 NOTE — Telephone Encounter (Signed)
Transition Care Management Unsuccessful Follow-up Telephone Call  Date of discharge and from where:  12/10/202-[facility]  Attempts:  3rd Attempt  Reason for unsuccessful TCM follow-up call:  Unable to leave message

## 2021-11-17 ENCOUNTER — Telehealth (HOSPITAL_COMMUNITY): Payer: Self-pay | Admitting: *Deleted

## 2021-11-17 NOTE — Telephone Encounter (Signed)
Attempted hospital discharge follow-up call. "Call unable to be completed at this time." Deforest Hoyles, RN, 11/17/21, [phone]

## 2023-01-09 DIAGNOSIS — Z3201 Encounter for pregnancy test, result positive: Secondary | ICD-10-CM | POA: Diagnosis not present

## 2023-01-11 DIAGNOSIS — Z3A13 13 weeks gestation of pregnancy: Secondary | ICD-10-CM | POA: Diagnosis not present

## 2023-01-11 DIAGNOSIS — O210 Mild hyperemesis gravidarum: Secondary | ICD-10-CM | POA: Diagnosis not present

## 2023-01-11 DIAGNOSIS — Z369 Encounter for antenatal screening, unspecified: Secondary | ICD-10-CM | POA: Diagnosis not present

## 2023-01-11 DIAGNOSIS — Z3482 Encounter for supervision of other normal pregnancy, second trimester: Secondary | ICD-10-CM | POA: Diagnosis not present

## 2023-01-11 DIAGNOSIS — O3680X0 Pregnancy with inconclusive fetal viability, not applicable or unspecified: Secondary | ICD-10-CM | POA: Diagnosis not present

## 2023-01-23 DIAGNOSIS — O3432 Maternal care for cervical incompetence, second trimester: Secondary | ICD-10-CM | POA: Diagnosis not present

## 2023-01-23 DIAGNOSIS — Z3A14 14 weeks gestation of pregnancy: Secondary | ICD-10-CM | POA: Diagnosis not present

## 2023-01-23 DIAGNOSIS — O09292 Supervision of pregnancy with other poor reproductive or obstetric history, second trimester: Secondary | ICD-10-CM | POA: Diagnosis not present

## 2023-01-23 DIAGNOSIS — Z9889 Other specified postprocedural states: Secondary | ICD-10-CM | POA: Diagnosis not present

## 2023-01-25 DIAGNOSIS — O3432 Maternal care for cervical incompetence, second trimester: Secondary | ICD-10-CM | POA: Diagnosis not present

## 2023-01-25 DIAGNOSIS — Z3A15 15 weeks gestation of pregnancy: Secondary | ICD-10-CM | POA: Diagnosis not present

## 2023-02-13 DIAGNOSIS — Z113 Encounter for screening for infections with a predominantly sexual mode of transmission: Secondary | ICD-10-CM | POA: Diagnosis not present

## 2023-02-13 DIAGNOSIS — Z3A17 17 weeks gestation of pregnancy: Secondary | ICD-10-CM | POA: Diagnosis not present

## 2023-02-13 DIAGNOSIS — O99013 Anemia complicating pregnancy, third trimester: Secondary | ICD-10-CM | POA: Diagnosis not present

## 2023-02-13 DIAGNOSIS — O219 Vomiting of pregnancy, unspecified: Secondary | ICD-10-CM | POA: Diagnosis not present

## 2023-02-13 DIAGNOSIS — O3432 Maternal care for cervical incompetence, second trimester: Secondary | ICD-10-CM | POA: Diagnosis not present

## 2023-02-27 DIAGNOSIS — Z363 Encounter for antenatal screening for malformations: Secondary | ICD-10-CM | POA: Diagnosis not present

## 2023-02-27 DIAGNOSIS — O3432 Maternal care for cervical incompetence, second trimester: Secondary | ICD-10-CM | POA: Diagnosis not present

## 2023-02-27 DIAGNOSIS — O99012 Anemia complicating pregnancy, second trimester: Secondary | ICD-10-CM | POA: Diagnosis not present

## 2023-02-27 DIAGNOSIS — D649 Anemia, unspecified: Secondary | ICD-10-CM | POA: Diagnosis not present

## 2023-02-27 DIAGNOSIS — Z36 Encounter for antenatal screening for chromosomal anomalies: Secondary | ICD-10-CM | POA: Diagnosis not present

## 2023-02-27 DIAGNOSIS — Z3A20 20 weeks gestation of pregnancy: Secondary | ICD-10-CM | POA: Diagnosis not present

## 2023-03-13 DIAGNOSIS — D649 Anemia, unspecified: Secondary | ICD-10-CM | POA: Diagnosis not present

## 2023-03-13 DIAGNOSIS — O3432 Maternal care for cervical incompetence, second trimester: Secondary | ICD-10-CM | POA: Diagnosis not present

## 2023-03-13 DIAGNOSIS — Z3A21 21 weeks gestation of pregnancy: Secondary | ICD-10-CM | POA: Diagnosis not present

## 2023-03-13 DIAGNOSIS — O99012 Anemia complicating pregnancy, second trimester: Secondary | ICD-10-CM | POA: Diagnosis not present

## 2023-03-22 ENCOUNTER — Telehealth: Payer: Self-pay

## 2023-03-22 NOTE — Telephone Encounter (Signed)
Attempted to contact patient-no working #. AS, CMA 

## 2023-04-02 DIAGNOSIS — Z3689 Encounter for other specified antenatal screening: Secondary | ICD-10-CM | POA: Diagnosis not present

## 2023-04-02 DIAGNOSIS — Z3A24 24 weeks gestation of pregnancy: Secondary | ICD-10-CM | POA: Diagnosis not present

## 2023-04-02 DIAGNOSIS — D649 Anemia, unspecified: Secondary | ICD-10-CM | POA: Diagnosis not present

## 2023-04-02 DIAGNOSIS — O99012 Anemia complicating pregnancy, second trimester: Secondary | ICD-10-CM | POA: Diagnosis not present

## 2023-04-02 DIAGNOSIS — O3432 Maternal care for cervical incompetence, second trimester: Secondary | ICD-10-CM | POA: Diagnosis not present

## 2023-04-24 DIAGNOSIS — O3432 Maternal care for cervical incompetence, second trimester: Secondary | ICD-10-CM | POA: Diagnosis not present

## 2023-04-24 DIAGNOSIS — Z3A27 27 weeks gestation of pregnancy: Secondary | ICD-10-CM | POA: Diagnosis not present

## 2023-04-24 DIAGNOSIS — D649 Anemia, unspecified: Secondary | ICD-10-CM | POA: Diagnosis not present

## 2023-04-24 DIAGNOSIS — O99012 Anemia complicating pregnancy, second trimester: Secondary | ICD-10-CM | POA: Diagnosis not present

## 2023-05-02 DIAGNOSIS — O3433 Maternal care for cervical incompetence, third trimester: Secondary | ICD-10-CM | POA: Diagnosis not present

## 2023-05-02 DIAGNOSIS — Z3A29 29 weeks gestation of pregnancy: Secondary | ICD-10-CM | POA: Diagnosis not present

## 2023-05-02 DIAGNOSIS — M549 Dorsalgia, unspecified: Secondary | ICD-10-CM | POA: Diagnosis not present

## 2023-05-02 DIAGNOSIS — O99891 Other specified diseases and conditions complicating pregnancy: Secondary | ICD-10-CM | POA: Diagnosis not present

## 2023-06-28 DIAGNOSIS — O09293 Supervision of pregnancy with other poor reproductive or obstetric history, third trimester: Secondary | ICD-10-CM | POA: Diagnosis not present

## 2023-06-28 DIAGNOSIS — D649 Anemia, unspecified: Secondary | ICD-10-CM | POA: Diagnosis not present

## 2023-06-28 DIAGNOSIS — O3433 Maternal care for cervical incompetence, third trimester: Secondary | ICD-10-CM | POA: Diagnosis not present

## 2023-06-28 DIAGNOSIS — O99013 Anemia complicating pregnancy, third trimester: Secondary | ICD-10-CM | POA: Diagnosis not present

## 2023-06-28 DIAGNOSIS — Z3A37 37 weeks gestation of pregnancy: Secondary | ICD-10-CM | POA: Diagnosis not present

## 2023-06-28 DIAGNOSIS — O09893 Supervision of other high risk pregnancies, third trimester: Secondary | ICD-10-CM | POA: Diagnosis not present

## 2023-06-30 DIAGNOSIS — D62 Acute posthemorrhagic anemia: Secondary | ICD-10-CM | POA: Diagnosis not present

## 2023-06-30 DIAGNOSIS — Z30013 Encounter for initial prescription of injectable contraceptive: Secondary | ICD-10-CM | POA: Diagnosis not present

## 2023-06-30 DIAGNOSIS — Z3A37 37 weeks gestation of pregnancy: Secondary | ICD-10-CM | POA: Diagnosis not present

## 2023-06-30 DIAGNOSIS — O9902 Anemia complicating childbirth: Secondary | ICD-10-CM | POA: Diagnosis not present

## 2023-06-30 DIAGNOSIS — O4292 Full-term premature rupture of membranes, unspecified as to length of time between rupture and onset of labor: Secondary | ICD-10-CM | POA: Diagnosis not present

## 2024-09-11 DIAGNOSIS — Z131 Encounter for screening for diabetes mellitus: Secondary | ICD-10-CM | POA: Diagnosis not present

## 2024-09-11 DIAGNOSIS — Z0000 Encounter for general adult medical examination without abnormal findings: Secondary | ICD-10-CM | POA: Diagnosis not present

## 2024-09-11 DIAGNOSIS — N898 Other specified noninflammatory disorders of vagina: Secondary | ICD-10-CM | POA: Diagnosis not present

## 2024-09-11 DIAGNOSIS — Z1329 Encounter for screening for other suspected endocrine disorder: Secondary | ICD-10-CM | POA: Diagnosis not present

## 2024-09-11 DIAGNOSIS — Z113 Encounter for screening for infections with a predominantly sexual mode of transmission: Secondary | ICD-10-CM | POA: Diagnosis not present

## 2024-09-11 DIAGNOSIS — Z13220 Encounter for screening for lipoid disorders: Secondary | ICD-10-CM | POA: Diagnosis not present

## 2024-09-29 DIAGNOSIS — A599 Trichomoniasis, unspecified: Secondary | ICD-10-CM | POA: Diagnosis not present
